# Patient Record
Sex: Female | Born: 1965
Health system: Southern US, Community
[De-identification: ages and names within clinical notes are randomized; demographics above are authoritative.]

---

## 1995-03-26 HISTORY — PX: ELBOW SURGERY: SHX618

## 2004-02-22 ENCOUNTER — Ambulatory Visit: Payer: Self-pay | Admitting: Internal Medicine

## 2004-08-24 ENCOUNTER — Emergency Department (HOSPITAL_COMMUNITY): Admission: EM | Admit: 2004-08-24 | Discharge: 2004-08-24 | Payer: Self-pay | Admitting: Emergency Medicine

## 2007-06-23 ENCOUNTER — Other Ambulatory Visit: Admission: RE | Admit: 2007-06-23 | Discharge: 2007-06-23 | Payer: Self-pay | Admitting: Obstetrics and Gynecology

## 2007-07-14 ENCOUNTER — Encounter: Admission: RE | Admit: 2007-07-14 | Discharge: 2007-07-14 | Payer: Self-pay | Admitting: Obstetrics and Gynecology

## 2010-07-06 ENCOUNTER — Other Ambulatory Visit (HOSPITAL_COMMUNITY)
Admission: RE | Admit: 2010-07-06 | Discharge: 2010-07-06 | Disposition: A | Discharge status: Home / Self Care | Payer: Private Health Insurance - Indemnity | Source: Ambulatory Visit | Attending: Obstetrics and Gynecology | Admitting: Obstetrics and Gynecology

## 2010-07-06 ENCOUNTER — Other Ambulatory Visit: Payer: Self-pay | Admitting: Obstetrics and Gynecology

## 2010-07-06 DIAGNOSIS — Z1231 Encounter for screening mammogram for malignant neoplasm of breast: Secondary | ICD-10-CM

## 2010-07-06 DIAGNOSIS — Z01419 Encounter for gynecological examination (general) (routine) without abnormal findings: Secondary | ICD-10-CM | POA: Insufficient documentation

## 2010-07-06 DIAGNOSIS — Z113 Encounter for screening for infections with a predominantly sexual mode of transmission: Secondary | ICD-10-CM | POA: Insufficient documentation

## 2010-07-10 ENCOUNTER — Ambulatory Visit
Admission: RE | Admit: 2010-07-10 | Discharge: 2010-07-10 | Disposition: A | Discharge status: Home / Self Care | Payer: Private Health Insurance - Indemnity | Source: Ambulatory Visit | Attending: Obstetrics and Gynecology | Admitting: Obstetrics and Gynecology

## 2010-07-10 DIAGNOSIS — Z1231 Encounter for screening mammogram for malignant neoplasm of breast: Secondary | ICD-10-CM

## 2011-06-03 ENCOUNTER — Other Ambulatory Visit: Payer: Self-pay | Admitting: Family Medicine

## 2011-06-03 ENCOUNTER — Other Ambulatory Visit: Payer: Self-pay | Admitting: Family

## 2011-06-03 DIAGNOSIS — Z1231 Encounter for screening mammogram for malignant neoplasm of breast: Secondary | ICD-10-CM

## 2011-07-12 ENCOUNTER — Ambulatory Visit
Admission: RE | Admit: 2011-07-12 | Discharge: 2011-07-12 | Disposition: A | Discharge status: Home / Self Care | Payer: Private Health Insurance - Indemnity | Source: Ambulatory Visit | Attending: Family Medicine | Admitting: Family Medicine

## 2011-07-12 DIAGNOSIS — Z1231 Encounter for screening mammogram for malignant neoplasm of breast: Secondary | ICD-10-CM

## 2012-01-01 ENCOUNTER — Other Ambulatory Visit: Payer: Self-pay | Admitting: Dermatology

## 2012-06-16 ENCOUNTER — Other Ambulatory Visit: Payer: Self-pay

## 2012-06-16 DIAGNOSIS — Z1231 Encounter for screening mammogram for malignant neoplasm of breast: Secondary | ICD-10-CM

## 2012-07-24 ENCOUNTER — Ambulatory Visit
Admission: RE | Admit: 2012-07-24 | Discharge: 2012-07-24 | Disposition: A | Discharge status: Home / Self Care | Payer: Private Health Insurance - Indemnity | Source: Ambulatory Visit

## 2012-07-24 DIAGNOSIS — Z1231 Encounter for screening mammogram for malignant neoplasm of breast: Secondary | ICD-10-CM

## 2013-03-25 DIAGNOSIS — G4700 Insomnia, unspecified: Secondary | ICD-10-CM

## 2013-03-25 HISTORY — DX: Insomnia, unspecified: G47.00

## 2013-07-16 ENCOUNTER — Other Ambulatory Visit: Payer: Self-pay

## 2013-07-16 DIAGNOSIS — Z1231 Encounter for screening mammogram for malignant neoplasm of breast: Secondary | ICD-10-CM

## 2013-07-28 ENCOUNTER — Ambulatory Visit
Admission: RE | Admit: 2013-07-28 | Discharge: 2013-07-28 | Disposition: A | Discharge status: Home / Self Care | Payer: Private Health Insurance - Indemnity | Source: Ambulatory Visit

## 2013-07-28 ENCOUNTER — Encounter (INDEPENDENT_AMBULATORY_CARE_PROVIDER_SITE_OTHER): Payer: Self-pay

## 2013-07-28 DIAGNOSIS — Z1231 Encounter for screening mammogram for malignant neoplasm of breast: Secondary | ICD-10-CM

## 2014-06-28 ENCOUNTER — Other Ambulatory Visit: Payer: Self-pay

## 2014-06-28 DIAGNOSIS — Z1231 Encounter for screening mammogram for malignant neoplasm of breast: Secondary | ICD-10-CM

## 2014-08-01 ENCOUNTER — Ambulatory Visit
Admission: RE | Admit: 2014-08-01 | Discharge: 2014-08-01 | Disposition: A | Discharge status: Home / Self Care | Payer: Managed Care, Other (non HMO) | Source: Ambulatory Visit

## 2014-08-01 DIAGNOSIS — Z1231 Encounter for screening mammogram for malignant neoplasm of breast: Secondary | ICD-10-CM

## 2018-07-06 ENCOUNTER — Other Ambulatory Visit: Payer: Self-pay | Admitting: Nurse Practitioner

## 2018-07-06 DIAGNOSIS — L309 Dermatitis, unspecified: Secondary | ICD-10-CM | POA: Diagnosis not present

## 2018-07-21 DIAGNOSIS — L300 Nummular dermatitis: Secondary | ICD-10-CM | POA: Diagnosis not present

## 2018-07-21 DIAGNOSIS — G4700 Insomnia, unspecified: Secondary | ICD-10-CM | POA: Diagnosis not present

## 2018-07-21 DIAGNOSIS — Z1239 Encounter for other screening for malignant neoplasm of breast: Secondary | ICD-10-CM | POA: Diagnosis not present

## 2018-07-21 DIAGNOSIS — L299 Pruritus, unspecified: Secondary | ICD-10-CM | POA: Diagnosis not present

## 2018-07-22 ENCOUNTER — Other Ambulatory Visit: Payer: Self-pay | Admitting: Physician Assistant

## 2018-07-22 DIAGNOSIS — Z1231 Encounter for screening mammogram for malignant neoplasm of breast: Secondary | ICD-10-CM

## 2018-10-20 ENCOUNTER — Other Ambulatory Visit: Payer: Self-pay

## 2018-10-20 ENCOUNTER — Ambulatory Visit
Admission: RE | Admit: 2018-10-20 | Discharge: 2018-10-20 | Disposition: A | Discharge status: Home / Self Care | Payer: No Typology Code available for payment source | Source: Ambulatory Visit | Attending: Physician Assistant | Admitting: Physician Assistant

## 2018-10-20 DIAGNOSIS — Z1231 Encounter for screening mammogram for malignant neoplasm of breast: Secondary | ICD-10-CM

## 2018-12-07 MED FILL — TERBINAFINE HCL 250 MG TAB: 250 | 90 days supply | Qty: 90 | Fill #0

## 2018-12-16 ENCOUNTER — Telehealth: Payer: Self-pay | Admitting: Allergy & Immunology

## 2018-12-16 DIAGNOSIS — Z20822 Contact with and (suspected) exposure to covid-19: Secondary | ICD-10-CM

## 2018-12-16 NOTE — Telephone Encounter (Signed)
Added COVID antibody testing to labs.   Javanni Maring, MD [facility]   

## 2018-12-16 NOTE — Addendum Note (Signed)
Addended by: Valentina Shaggy on: 12/16/2018 02:02 PM   Modules accepted: Orders

## 2018-12-17 LAB — SARS-COV-2 ANTIBODIES: SARS-CoV-2 Antibodies: NEGATIVE

## 2019-08-24 ENCOUNTER — Other Ambulatory Visit: Payer: Self-pay | Admitting: Nurse Practitioner

## 2019-11-11 MED FILL — GAVILYTE-G SOLUTION: 236 | 1 days supply | Qty: 4000 | Fill #0

## 2019-12-17 ENCOUNTER — Ambulatory Visit (INDEPENDENT_AMBULATORY_CARE_PROVIDER_SITE_OTHER): Payer: No Typology Code available for payment source | Admitting: *Deleted

## 2019-12-17 DIAGNOSIS — Z23 Encounter for immunization: Secondary | ICD-10-CM | POA: Diagnosis not present

## 2019-12-17 NOTE — Progress Notes (Signed)
   Covid-19 Vaccination Clinic  Name:  Patricia Goodman    MRN: [id number] DOB: 02-20-1966  12/17/2019  Patricia Goodman was observed post Covid-19 immunization for 30 minutes based on pre-vaccination screening without incident. She was provided with Vaccine Information Sheet and instruction to access the V-Safe system.   Patricia Goodman was instructed to call 911 with any severe reactions post vaccine: Marland Kitchen Difficulty breathing  . Swelling of face and throat  . A fast heartbeat  . A bad rash all over body  . Dizziness and weakness

## 2020-03-06 ENCOUNTER — Other Ambulatory Visit: Payer: Self-pay | Admitting: Nurse Practitioner

## 2020-03-06 DIAGNOSIS — Z1231 Encounter for screening mammogram for malignant neoplasm of breast: Secondary | ICD-10-CM

## 2020-04-14 ENCOUNTER — Ambulatory Visit: Payer: No Typology Code available for payment source

## 2020-04-25 ENCOUNTER — Other Ambulatory Visit (HOSPITAL_BASED_OUTPATIENT_CLINIC_OR_DEPARTMENT_OTHER): Payer: Self-pay | Admitting: Nurse Practitioner

## 2020-04-25 MED FILL — valACYclovir HCL 1 GM TABS: 1 | 7 days supply | Qty: 21 | Fill #0

## 2020-06-06 ENCOUNTER — Inpatient Hospital Stay: Admission: RE | Admit: 2020-06-06 | Payer: No Typology Code available for payment source | Source: Ambulatory Visit

## 2020-07-21 ENCOUNTER — Other Ambulatory Visit: Payer: Self-pay

## 2020-07-21 ENCOUNTER — Ambulatory Visit: Payer: No Typology Code available for payment source

## 2020-08-01 ENCOUNTER — Other Ambulatory Visit (HOSPITAL_BASED_OUTPATIENT_CLINIC_OR_DEPARTMENT_OTHER): Payer: Self-pay

## 2020-08-01 MED ORDER — ZOLPIDEM TARTRATE 10 MG PO TABS
ORAL_TABLET | ORAL | 1 refills | Status: AC
  Filled 2020-08-01: qty 30, 30d supply, fill #0

## 2020-08-15 ENCOUNTER — Other Ambulatory Visit: Payer: Self-pay | Admitting: Nurse Practitioner

## 2020-08-15 ENCOUNTER — Other Ambulatory Visit: Payer: Self-pay

## 2020-08-15 ENCOUNTER — Ambulatory Visit
Admission: RE | Admit: 2020-08-15 | Discharge: 2020-08-15 | Disposition: A | Discharge status: Home / Self Care | Payer: No Typology Code available for payment source | Source: Ambulatory Visit | Attending: Nurse Practitioner | Admitting: Nurse Practitioner

## 2020-08-15 DIAGNOSIS — Z1231 Encounter for screening mammogram for malignant neoplasm of breast: Secondary | ICD-10-CM

## 2020-08-15 DIAGNOSIS — N6459 Other signs and symptoms in breast: Secondary | ICD-10-CM

## 2020-09-22 ENCOUNTER — Ambulatory Visit
Admission: RE | Admit: 2020-09-22 | Discharge: 2020-09-22 | Disposition: A | Discharge status: Home / Self Care | Payer: No Typology Code available for payment source | Source: Ambulatory Visit | Attending: Nurse Practitioner | Admitting: Nurse Practitioner

## 2020-09-22 ENCOUNTER — Other Ambulatory Visit: Payer: Self-pay

## 2020-09-22 DIAGNOSIS — N6459 Other signs and symptoms in breast: Secondary | ICD-10-CM

## 2020-10-04 ENCOUNTER — Ambulatory Visit (INDEPENDENT_AMBULATORY_CARE_PROVIDER_SITE_OTHER): Payer: No Typology Code available for payment source

## 2020-10-04 ENCOUNTER — Other Ambulatory Visit: Payer: Self-pay

## 2020-10-04 DIAGNOSIS — Z23 Encounter for immunization: Secondary | ICD-10-CM | POA: Diagnosis not present

## 2020-10-04 NOTE — Progress Notes (Signed)
   Covid-19 Vaccination Clinic  Name:  Patricia Goodman    MRN: [id number] DOB: 1965-05-08  10/04/2020  [name] was observed post Covid-19 immunization for 15 minutes without incident. She was provided with Vaccine Information Sheet and instruction to access the V-Safe system.   [name] was instructed to call 911 with any severe reactions post vaccine: Difficulty breathing  Swelling of face and throat  A fast heartbeat  A bad rash all over body  Dizziness and weakness   Immunizations Administered     Name Date Dose VIS Date Route   PFIZER Comrnaty(Gray TOP) Covid-19 Vaccine 10/04/2020  1:39 PM 0.3 mL 03/02/2020 Intramuscular   Manufacturer: [employer], [employer]   Lot: [id number]   NDC: [phone]

## 2021-03-05 ENCOUNTER — Other Ambulatory Visit: Payer: Self-pay

## 2021-03-05 ENCOUNTER — Ambulatory Visit (INDEPENDENT_AMBULATORY_CARE_PROVIDER_SITE_OTHER): Payer: No Typology Code available for payment source

## 2021-03-05 DIAGNOSIS — Z23 Encounter for immunization: Secondary | ICD-10-CM | POA: Diagnosis not present

## 2021-03-05 NOTE — Progress Notes (Signed)
   Covid-19 Vaccination Clinic  Name:  ZYANNA LEISINGER    MRN: [id number] DOB: 12-15-1965  03/05/2021  Ms. Lyttle was observed post Covid-19 immunization for 15 minutes without incident. She was provided with Vaccine Information Sheet and instruction to access the V-Safe system.   Ms. Wurtz was instructed to call 911 with any severe reactions post vaccine: Difficulty breathing  Swelling of face and throat  A fast heartbeat  A bad rash all over body  Dizziness and weakness   Immunizations Administered     Name Date Dose VIS Date Route   Pfizer Covid-19 Vaccine Bivalent Booster 03/05/2021  9:55 AM 0.3 mL 11/22/2020 Intramuscular   Manufacturer: [employer], [employer]   Lot: [id number]   NDC: [phone]

## 2021-10-02 ENCOUNTER — Telehealth: Payer: Self-pay | Admitting: Allergy & Immunology

## 2021-10-02 NOTE — Telephone Encounter (Signed)
I contacted Patricia Goodman or the patient's caregiver to inform them that she received an expired dose of Pfizer COVID-19 vaccine from our office, during their visit(s), on July 2022 and December 2022. The dose was expired by 125 and 83 days, respectively. This was the patient's booster doses. I shared the following information with the patient or caregiver: vaccines given after they have expired may be less effective but we are not aware of any other adverse effects. The patient can be re-vaccinated at no cost if the patient decides to do so. Answered patient questions/concerns. Encouraged patient to reach out if they have any additional questions or concerns.  The patient decided to be re-vaccinated with the reformulated vaccine in the fall.  Patricia Bonds, MD [facility]

## 2022-05-16 ENCOUNTER — Other Ambulatory Visit (HOSPITAL_COMMUNITY): Payer: Self-pay

## 2022-05-16 ENCOUNTER — Other Ambulatory Visit: Payer: Self-pay

## 2022-05-16 MED ORDER — ESTRADIOL 0.1 MG/GM VA CREA
TOPICAL_CREAM | VAGINAL | 11 refills | Status: DC
  Filled 2022-05-16: qty 42.5, 90d supply, fill #0
  Filled 2022-08-16: qty 42.5, 90d supply, fill #1

## 2022-05-29 DIAGNOSIS — Z3202 Encounter for pregnancy test, result negative: Secondary | ICD-10-CM | POA: Diagnosis not present

## 2022-05-29 DIAGNOSIS — R87810 Cervical high risk human papillomavirus (HPV) DNA test positive: Secondary | ICD-10-CM | POA: Diagnosis not present

## 2022-05-29 DIAGNOSIS — N870 Mild cervical dysplasia: Secondary | ICD-10-CM | POA: Diagnosis not present

## 2022-08-09 DIAGNOSIS — F5104 Psychophysiologic insomnia: Secondary | ICD-10-CM | POA: Diagnosis not present

## 2022-08-16 ENCOUNTER — Other Ambulatory Visit (HOSPITAL_COMMUNITY): Payer: Self-pay

## 2022-09-20 IMAGING — MG DIGITAL DIAGNOSTIC BILAT W/ TOMO W/ CAD
6 of 10 series · 6 of 30 positions shown · non-contrast
Comparison: Previous exam(s).

CLINICAL DATA: Patient complains of left nipple inversion.

EXAM:
DIGITAL DIAGNOSTIC BILATERAL MAMMOGRAM WITH TOMOSYNTHESIS AND CAD;
ULTRASOUND LEFT BREAST LIMITED
TECHNIQUE: Bilateral digital diagnostic mammography and breast tomosynthesis
was performed. The images were evaluated with computer-aided
detection.; Targeted ultrasound examination of the left breast was
performed

[L CC synth-2D (1 of 2)]
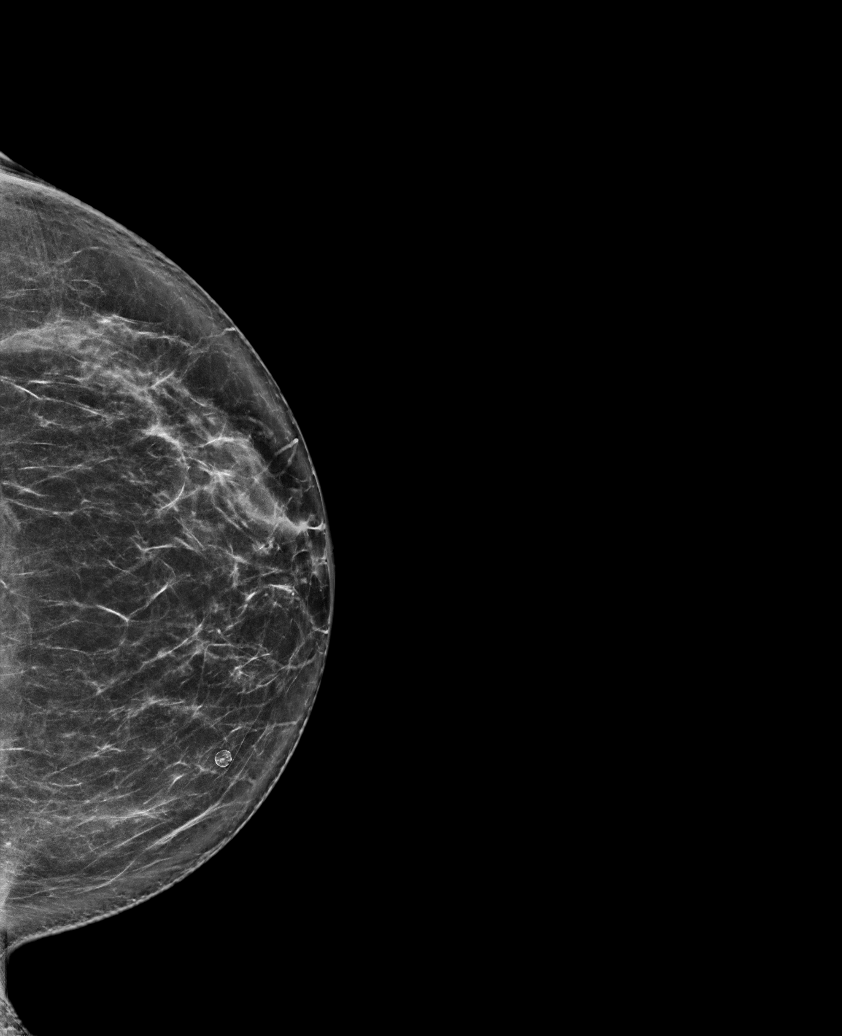

[R MLO synth-2D]
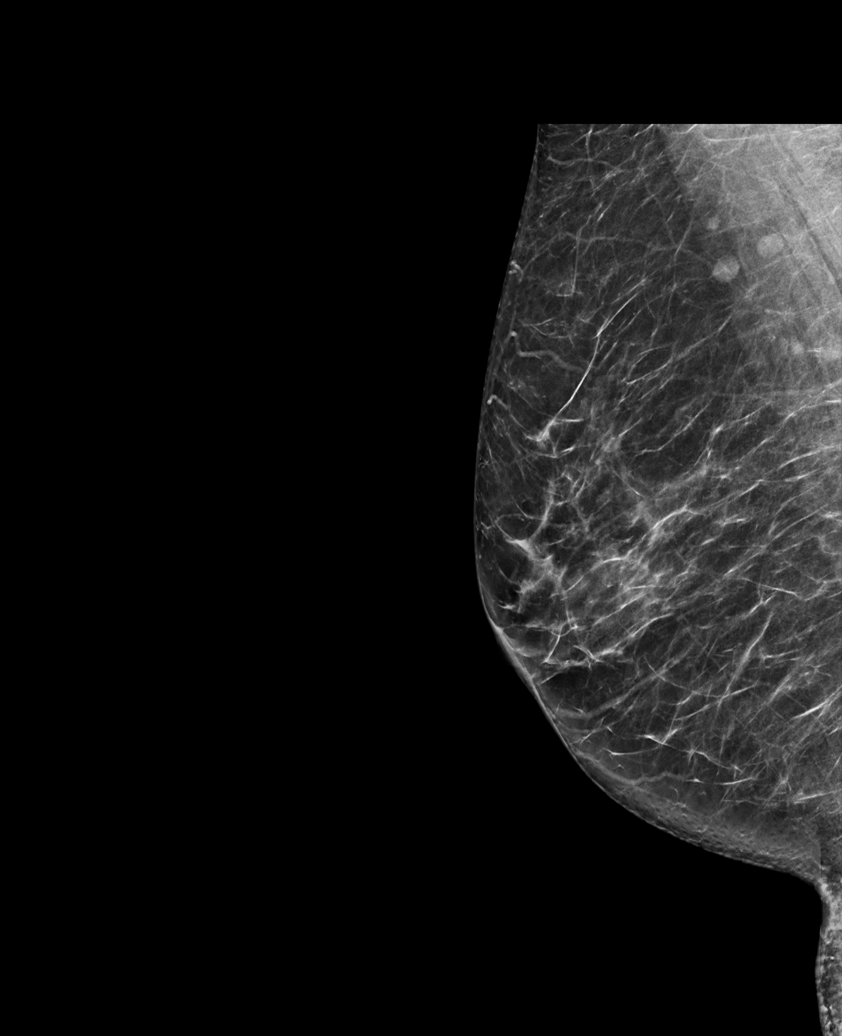

[R CC synth-2D]
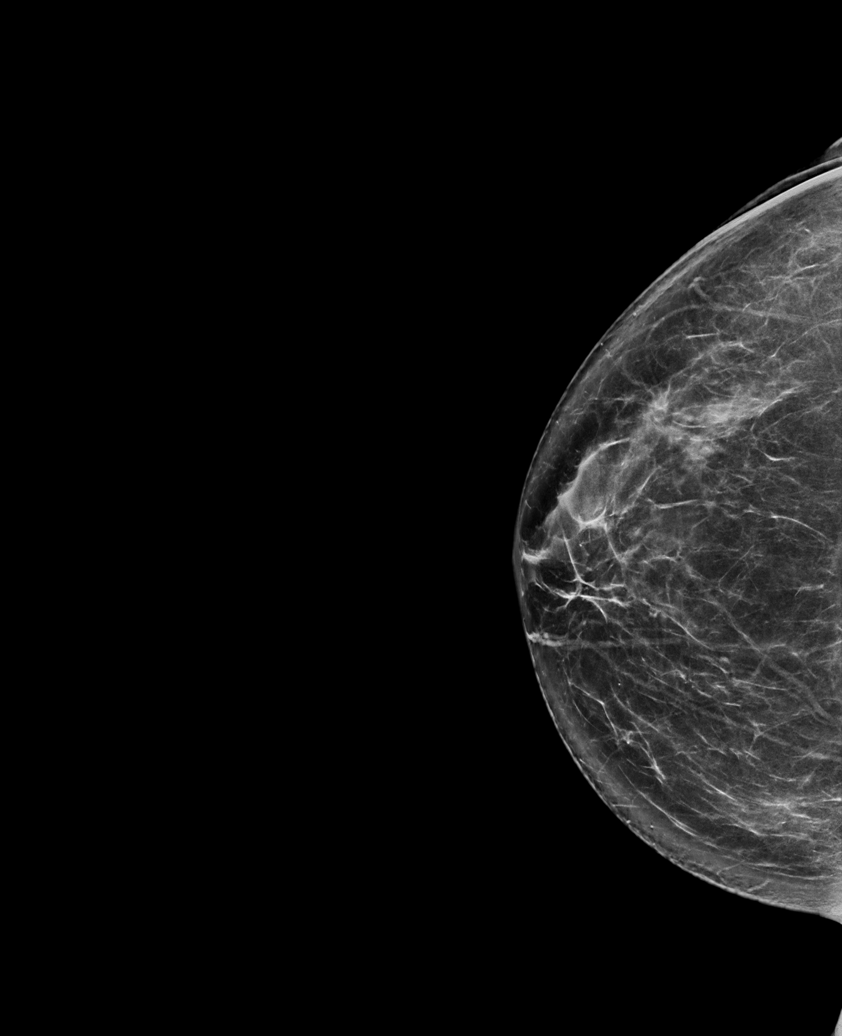

[L CC synth-2D (2 of 2)]
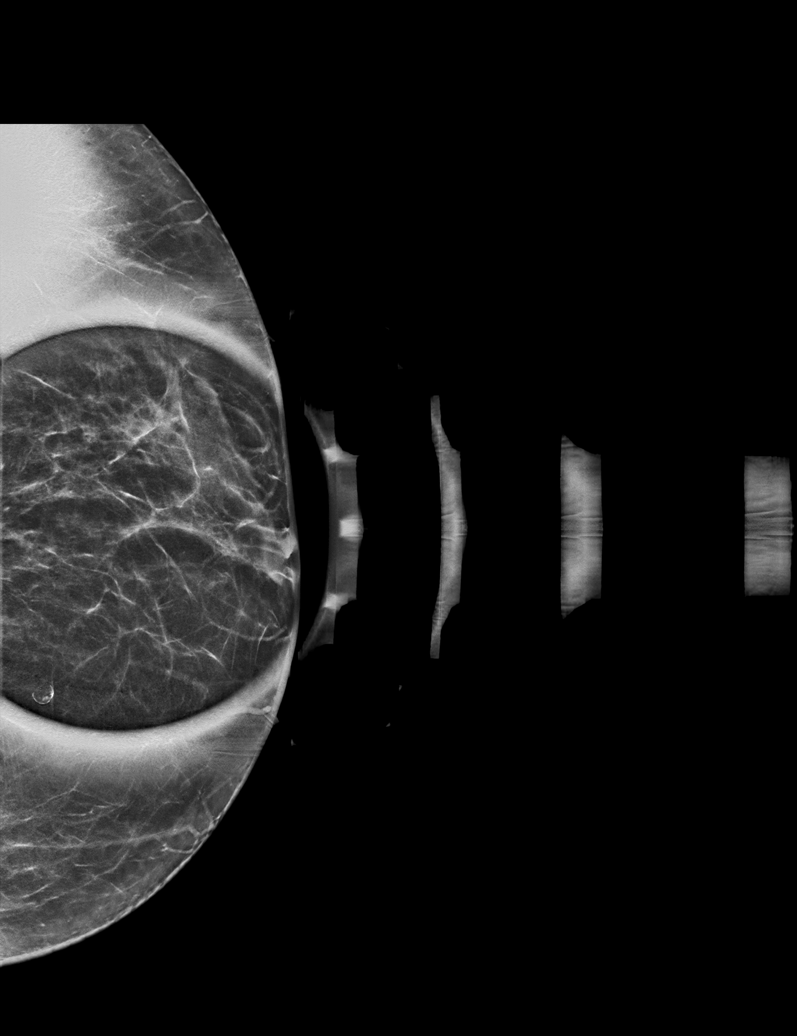

[L MLO synth-2D]
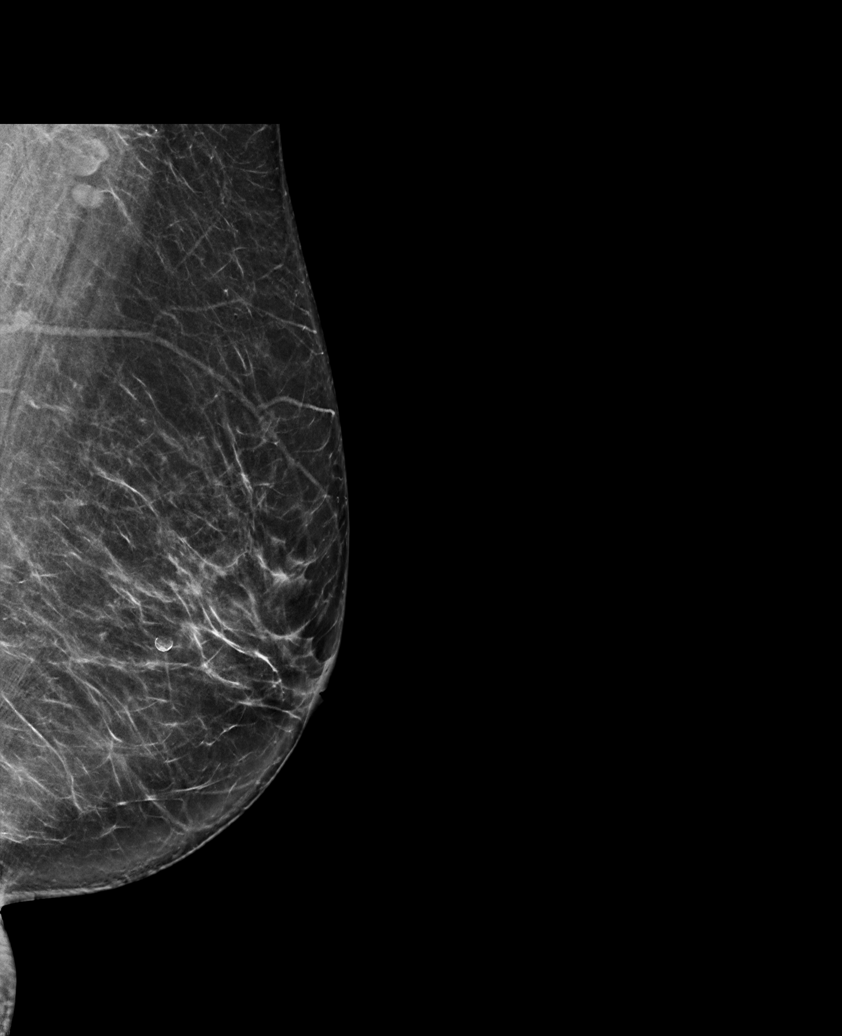

[R CC tomo · tomo slice 41/81.0]
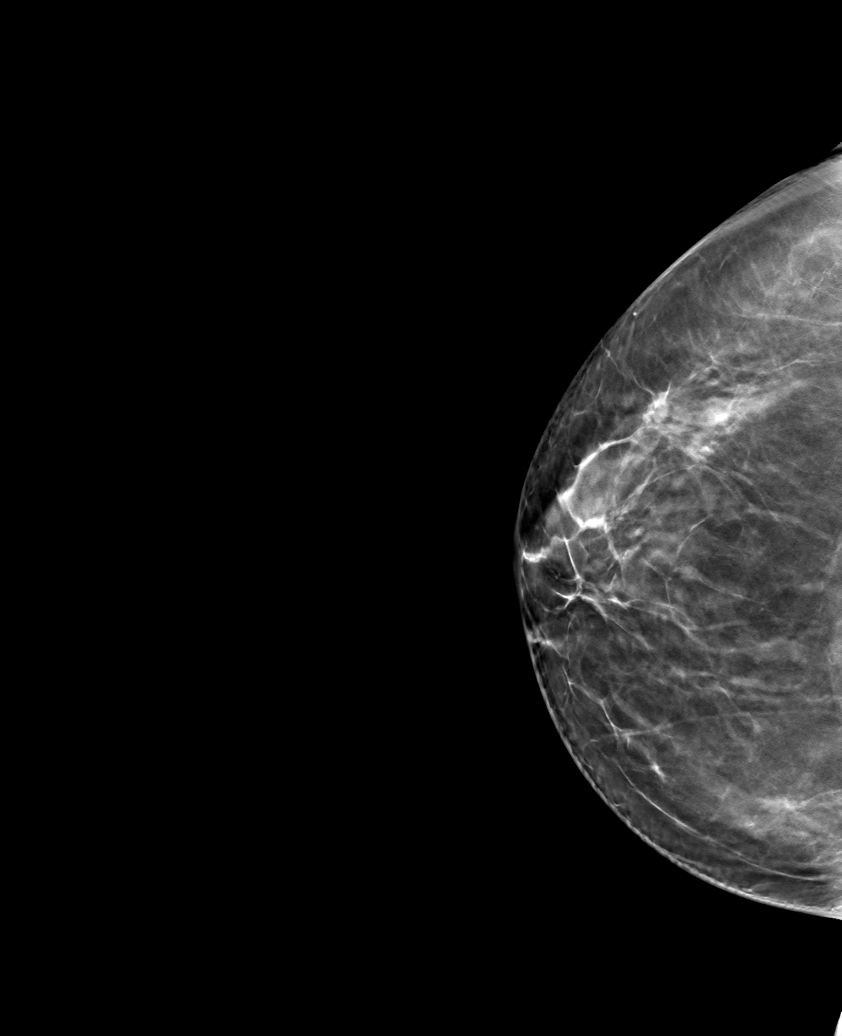

[6 of 30 positions shown; findings below may reference images not displayed]

ACR Breast Density Category b: There are scattered areas of
fibroglandular density.
FINDINGS: No suspicious mass, malignant type microcalcifications or distortion
detected in either breast. Spot tangential view of the subareolar
region of the left breast shows normal fibroglandular tissue.

On physical exam, there is a minor indentation of the left nipple.
It appears symmetric to the right nipple. I do not palpate a
subareolar mass.

Targeted ultrasound is performed, showing normal tissue in the
subareolar region of the left breast. No intraductal mass,
suspicious mass, distortion or abnormal shadowing detected.
IMPRESSION: No evidence of malignancy in either breast.

RECOMMENDATION:
Bilateral screening mammogram in 1 year is recommended.

The importance of self-breast examination was discussed with the
patient. If the patient notices any suspicious findings additional
imaging evaluation would be recommended.

I have discussed the findings and recommendations with the patient.
If applicable, a reminder letter will be sent to the patient
regarding the next appointment.

BI-RADS CATEGORY  1: Negative.

## 2022-10-24 DIAGNOSIS — M25552 Pain in left hip: Secondary | ICD-10-CM | POA: Diagnosis not present

## 2022-10-24 DIAGNOSIS — M25551 Pain in right hip: Secondary | ICD-10-CM | POA: Diagnosis not present

## 2022-11-13 ENCOUNTER — Ambulatory Visit
Admission: RE | Admit: 2022-11-13 | Discharge: 2022-11-13 | Disposition: A | Discharge status: Home / Self Care | Payer: 59 | Source: Ambulatory Visit | Attending: Family Medicine | Admitting: Family Medicine

## 2022-11-13 ENCOUNTER — Other Ambulatory Visit: Payer: Self-pay | Admitting: Family Medicine

## 2022-11-13 DIAGNOSIS — M25551 Pain in right hip: Secondary | ICD-10-CM

## 2022-11-13 DIAGNOSIS — M160 Bilateral primary osteoarthritis of hip: Secondary | ICD-10-CM | POA: Diagnosis not present

## 2022-11-13 DIAGNOSIS — M25552 Pain in left hip: Secondary | ICD-10-CM | POA: Diagnosis not present

## 2022-12-11 DIAGNOSIS — M25552 Pain in left hip: Secondary | ICD-10-CM | POA: Diagnosis not present

## 2022-12-11 DIAGNOSIS — Z1231 Encounter for screening mammogram for malignant neoplasm of breast: Secondary | ICD-10-CM | POA: Diagnosis not present

## 2022-12-20 ENCOUNTER — Other Ambulatory Visit (HOSPITAL_COMMUNITY): Payer: Self-pay

## 2022-12-20 MED ORDER — MELOXICAM 15 MG PO TABS
15.0000 mg | ORAL_TABLET | Freq: Every day | ORAL | 3 refills | Status: DC | PRN
  Filled 2022-12-20 – 2022-12-23 (×2): qty 30, 30d supply, fill #0
  Filled 2023-05-03: qty 30, 30d supply, fill #1

## 2022-12-21 ENCOUNTER — Other Ambulatory Visit (HOSPITAL_COMMUNITY): Payer: Self-pay

## 2022-12-23 ENCOUNTER — Other Ambulatory Visit (HOSPITAL_COMMUNITY): Payer: Self-pay

## 2022-12-23 ENCOUNTER — Other Ambulatory Visit: Payer: Self-pay

## 2022-12-29 DIAGNOSIS — M545 Low back pain, unspecified: Secondary | ICD-10-CM | POA: Diagnosis not present

## 2023-03-26 DIAGNOSIS — M1612 Unilateral primary osteoarthritis, left hip: Secondary | ICD-10-CM

## 2023-03-26 HISTORY — DX: Unilateral primary osteoarthritis, left hip: M16.12

## 2023-04-02 DIAGNOSIS — M25552 Pain in left hip: Secondary | ICD-10-CM | POA: Diagnosis not present

## 2023-04-02 DIAGNOSIS — F5104 Psychophysiologic insomnia: Secondary | ICD-10-CM | POA: Diagnosis not present

## 2023-04-17 ENCOUNTER — Other Ambulatory Visit: Payer: Self-pay

## 2023-04-17 ENCOUNTER — Ambulatory Visit: Payer: Commercial Managed Care - PPO | Admitting: Sports Medicine

## 2023-04-17 ENCOUNTER — Ambulatory Visit: Payer: Commercial Managed Care - PPO | Admitting: Orthopaedic Surgery

## 2023-04-17 DIAGNOSIS — M1612 Unilateral primary osteoarthritis, left hip: Secondary | ICD-10-CM

## 2023-04-17 MED ORDER — LIDOCAINE HCL 1 % IJ SOLN
4.0000 mL | INTRAMUSCULAR | Status: AC | PRN
  Administered 2023-04-17: 4 mL

## 2023-04-17 MED ORDER — METHYLPREDNISOLONE ACETATE 40 MG/ML IJ SUSP
80.0000 mg | INTRAMUSCULAR | Status: AC | PRN
  Administered 2023-04-17: 80 mg via INTRA_ARTICULAR

## 2023-04-17 NOTE — Progress Notes (Signed)
   Procedure Note  Patient: Patricia Goodman             Date of Birth: 12/07/1965           MRN: [id number]             Visit Date: 04/17/2023  Procedures: Visit Diagnoses:  1. Primary osteoarthritis of left hip    Large Joint Inj: L hip joint on 04/17/2023 11:32 AM Indications: pain Details: 22 G 3.5 in needle, ultrasound-guided anterior approach Medications: 4 mL lidocaine 1 %; 80 mg methylPREDNISolone acetate 40 MG/ML Outcome: tolerated well, no immediate complications  Procedure: US-guided intra-articular hip injection, Left  After discussion on risks/benefits/indications and informed verbal consent was obtained, a timeout was performed. Patient was lying supine on exam table. The hip was cleaned with betadine and alcohol swabs. Then utilizing ultrasound guidance, the patient's femoral head and neck junction was identified and subsequently injected with 4:2 lidocaine:depomedrol via an in-plane approach with ultrasound visualization of the injectate administered into the hip joint. Patient tolerated procedure well without immediate complications.  Procedure, treatment alternatives, risks and benefits explained, specific risks discussed. Consent was given by the patient. Immediately prior to procedure a time out was called to verify the correct patient, procedure, equipment, support staff and site/side marked as required. Patient was prepped and draped in the usual sterile fashion.     - follow-up with Dr. Roda Shutters as indicated; I am happy to see them as needed  Madelyn Brunner, DO Primary Care Sports Medicine Physician  [facility]  This note was dictated using Dragon naturally speaking software and may contain errors in syntax, spelling, or content which have not been identified prior to signing this note.

## 2023-04-17 NOTE — Progress Notes (Signed)
Office Visit Note   Patient: Patricia Goodman           Date of Birth: [birth year]           MRN: [id number] Visit Date: 04/17/2023              Requested by: Joycelyn Rua, MD [address] [place],  [place] [address] PCP: Joycelyn Rua, MD   Assessment & Plan: Visit Diagnoses:  1. Primary osteoarthritis of left hip     Plan: Impression is 58 year old female with symptomatic left hip osteoarthritis.  There are degenerative changes on x-rays from August.  Treatment options were given and she would like to do outpatient physical therapy and try cortisone injection.  She currently takes ibuprofen as needed.  Follow-up as needed.  Follow-Up Instructions: No follow-ups on file.   Orders:  Orders Placed This Encounter  Procedures   Ambulatory referral to Physical Therapy   No orders of the defined types were placed in this encounter.     Procedures: No procedures performed   Clinical Data: No additional findings.   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  She comes in for worsening left hip pain for the last year.  She has had chronic pain since 2008 but has been manageable until the last year.  Denies any changes in activity or weight.  Feels a dull aching pain deep inside the hip and sometimes into the groin. Review of Systems  Constitutional: Negative.   HENT: Negative.    Eyes: Negative.   Respiratory: Negative.    Cardiovascular: Negative.   Endocrine: Negative.   Musculoskeletal: Negative.   Neurological: Negative.   Hematological: Negative.   Psychiatric/Behavioral: Negative.    All other systems reviewed and are negative.    Objective: Vital Signs: There were no vitals taken for this visit.  Physical Exam Vitals and nursing note reviewed.  Constitutional:      Appearance: She is well-developed.  HENT:     Head:  Atraumatic.     Nose: Nose normal.  Eyes:     Extraocular Movements: Extraocular movements intact.  Cardiovascular:     Pulses: Normal pulses.  Pulmonary:     Effort: Pulmonary effort is normal.  Abdominal:     Palpations: Abdomen is soft.  Musculoskeletal:     Cervical back: Neck supple.  Skin:    General: Skin is warm.     Capillary Refill: Capillary refill takes less than 2 seconds.  Neurological:     Mental Status: She is alert. Mental status is at baseline.  Psychiatric:        Behavior: Behavior normal.        Thought Content: Thought content normal.        Judgment: Judgment normal.     Ortho Exam Examination of the left hip shows no trochanteric tenderness or sciatic tension signs.  She has pain with logroll internally and with FABER and FADIR.  Negative [place]field. Specialty Comments:  No specialty comments available.  Imaging: No results found.   PMFS History: There are no active problems to display for this patient.  No past medical history on file.  Family History  Problem Relation Age of Onset   Breast cancer Maternal Grandmother     No past surgical history on file. Social History   Occupational History  Not on file  Tobacco Use   Smoking status: Not on file   Smokeless tobacco: Not on file  Substance and Sexual Activity   Alcohol use: Not on file   Drug use: Not on file   Sexual activity: Not on file

## 2023-04-22 ENCOUNTER — Ambulatory Visit (INDEPENDENT_AMBULATORY_CARE_PROVIDER_SITE_OTHER): Payer: Commercial Managed Care - PPO | Admitting: Orthopaedic Surgery

## 2023-04-22 DIAGNOSIS — M1612 Unilateral primary osteoarthritis, left hip: Secondary | ICD-10-CM

## 2023-04-22 MED ORDER — HYDROCODONE-ACETAMINOPHEN 5-325 MG PO TABS: 1.0000 | ORAL_TABLET | Freq: Four times a day (QID) | ORAL | 0 refills | Status: DC | PRN

## 2023-04-22 NOTE — Progress Notes (Signed)
   Office Visit Note   Patient: Patricia Goodman           Date of Birth: Jun 07, 1965           MRN: [id number] Visit Date: 04/22/2023              Requested by: Joycelyn Rua, MD [address] [place],  [place] [address] PCP: Joycelyn Rua, MD   Assessment & Plan: Visit Diagnoses:  1. Primary osteoarthritis of left hip     Plan: And is a 58 year old female with left hip pain from postinjection flareup.  I will send in Norco for breakthrough pain.  Prescription for cane if needed.  Out of work note for a week for now but I am happy to extend it as needed.  She will continue to take ibuprofen.  Follow-Up Instructions: No follow-ups on file.   Orders:  No orders of the defined types were placed in this encounter.  Meds ordered this encounter  Medications   HYDROcodone-acetaminophen (NORCO) 5-325 MG tablet    Sig: Take 1 tablet by mouth every 6 (six) hours as needed.    Dispense:  20 tablet    Refill:  0      Procedures: No procedures performed   Clinical Data: No additional findings.   Subjective: Chief Complaint  Patient presents with   Left Hip - Pain    HPI Patient is a very pleasant 58 year old female comes in for increasing left hip pain 1 week status post left hip injection for osteoarthritis.  Pain gradually got worse and she was unable to work or walk due to it.  Ibuprofen helps temporarily. Review of Systems   Objective: Vital Signs: There were no vitals taken for this visit.  Physical Exam  Ortho Exam Examination of the left hip shows pain with weightbearing and antalgic gait.  Pain with passive range of motion. Specialty Comments:  No specialty comments available.  Imaging: No results found.   PMFS History: There are no active problems to display for this patient.  No past medical history on file.  Family History  Problem Relation Age of Onset   Breast cancer Maternal Grandmother     No past surgical history on file. Social History    Occupational History   Not on file  Tobacco Use   Smoking status: Not on file   Smokeless tobacco: Not on file  Substance and Sexual Activity   Alcohol use: Not on file   Drug use: Not on file   Sexual activity: Not on file

## 2023-04-24 ENCOUNTER — Encounter: Payer: Self-pay | Admitting: Orthopaedic Surgery

## 2023-04-24 NOTE — Telephone Encounter (Signed)
Please order MRI of the left hip to rule out fracture.  Thanks.

## 2023-04-25 ENCOUNTER — Other Ambulatory Visit: Payer: Self-pay

## 2023-04-25 ENCOUNTER — Other Ambulatory Visit: Payer: Self-pay | Admitting: Orthopaedic Surgery

## 2023-04-25 DIAGNOSIS — M25552 Pain in left hip: Secondary | ICD-10-CM

## 2023-04-25 MED ORDER — OXYCODONE-ACETAMINOPHEN 5-325 MG PO TABS: 1.0000 | ORAL_TABLET | Freq: Two times a day (BID) | ORAL | 0 refills | Status: DC | PRN

## 2023-04-25 NOTE — Telephone Encounter (Signed)
I sent Percocet.

## 2023-04-28 ENCOUNTER — Encounter: Payer: Self-pay | Admitting: Orthopaedic Surgery

## 2023-04-28 ENCOUNTER — Other Ambulatory Visit: Payer: Self-pay | Admitting: Physician Assistant

## 2023-04-28 ENCOUNTER — Ambulatory Visit
Admission: RE | Admit: 2023-04-28 | Discharge: 2023-04-28 | Disposition: A | Discharge status: Home / Self Care | Payer: Commercial Managed Care - PPO | Source: Ambulatory Visit | Attending: Orthopaedic Surgery | Admitting: Orthopaedic Surgery

## 2023-04-28 DIAGNOSIS — M25552 Pain in left hip: Secondary | ICD-10-CM | POA: Diagnosis not present

## 2023-04-28 DIAGNOSIS — M25452 Effusion, left hip: Secondary | ICD-10-CM | POA: Diagnosis not present

## 2023-04-28 DIAGNOSIS — R600 Localized edema: Secondary | ICD-10-CM | POA: Diagnosis not present

## 2023-04-28 NOTE — Telephone Encounter (Signed)
It sounds like it is taking a few weeks for the MRIs to be ready, so I would come back two weeks after the MRI unless we get the report sooner and need to see her back sooner

## 2023-04-28 NOTE — Telephone Encounter (Signed)
Looks like oxy was sent in on 1/31.  Cannot write for anything stronger.  Ok for another week out of work

## 2023-04-29 ENCOUNTER — Other Ambulatory Visit: Payer: Self-pay | Admitting: Orthopaedic Surgery

## 2023-04-29 MED ORDER — PREDNISONE 10 MG (21) PO TBPK: ORAL_TABLET | ORAL | 3 refills | Status: DC

## 2023-04-29 MED ORDER — METHOCARBAMOL 500 MG PO TABS: 500.0000 mg | ORAL_TABLET | Freq: Four times a day (QID) | ORAL | 2 refills | Status: DC | PRN

## 2023-04-30 ENCOUNTER — Other Ambulatory Visit: Payer: Self-pay | Admitting: Orthopaedic Surgery

## 2023-04-30 NOTE — Telephone Encounter (Signed)
 I would hold this until tomorrow when Christiane Cowing and Loris Ros are back because it looks like she is not postop so not sure if they typically would refill this medication

## 2023-05-01 NOTE — Progress Notes (Signed)
 Office Visit Note   Patient: Patricia Goodman           Date of Birth: Jan 19, 1966           MRN: [id number] Visit Date: 05/02/2023              Requested by: Nanci Senior, MD [address] [place],  [place] [address] PCP: Nanci Senior, MD   Assessment & Plan: Visit Diagnoses:  1. Pain in left hip     Plan: MRI of the left hip shows mild degenerative changes within the hip joint and degenerative labral tear.  She does have widespread periarticular soft tissue edema in the surrounding hip musculature.  Based on these findings I have recommended continued rest and activity modification.  Will obtain arthritis panel.  60 mg of IM Toradol  administered.  Out of work note for 2 weeks.  Zanaflex  prescribed.  Follow-Up Instructions: No follow-ups on file.   Orders:  No orders of the defined types were placed in this encounter.  Meds ordered this encounter  Medications   tiZANidine  (ZANAFLEX ) 4 MG tablet    Sig: Take 1 tablet (4 mg total) by mouth every 6 (six) hours as needed for muscle spasms.    Dispense:  30 tablet    Refill:  2      Procedures: No procedures performed   Clinical Data: No additional findings.   Subjective: Chief Complaint  Patient presents with   Other    Review MRI    HPI Patient returns today to discuss MRI scan.  She denies any significant improvement in symptoms. Review of Systems  Constitutional: Negative.   HENT: Negative.    Eyes: Negative.   Respiratory: Negative.    Cardiovascular: Negative.   Endocrine: Negative.   Musculoskeletal: Negative.   Neurological: Negative.   Hematological: Negative.   Psychiatric/Behavioral: Negative.    All other systems reviewed and are negative.    Objective: Vital Signs: There were no vitals taken for this visit.  Physical Exam Vitals and nursing note reviewed.  Constitutional:      Appearance: She is well-developed.  HENT:     Head: Normocephalic and atraumatic.  Pulmonary:      Effort: Pulmonary effort is normal.  Abdominal:     Palpations: Abdomen is soft.  Musculoskeletal:     Cervical back: Neck supple.  Skin:    General: Skin is warm.     Capillary Refill: Capillary refill takes less than 2 seconds.  Neurological:     Mental Status: She is alert and oriented to person, place, and time.  Psychiatric:        Behavior: Behavior normal.        Thought Content: Thought content normal.        Judgment: Judgment normal.    Ortho Exam Examination of the left hip shows pain with all planes of motion.  Antalgic gait. Specialty Comments:  No specialty comments available.  Imaging: No results found.   PMFS History: There are no active problems to display for this patient.  No past medical history on file.  Family History  Problem Relation Age of Onset   Breast cancer Maternal Grandmother     No past surgical history on file. Social History   Occupational History   Not on file  Tobacco Use   Smoking status: Not on file   Smokeless tobacco: Not on file  Substance and Sexual Activity   Alcohol use: Not on file  Drug use: Not on file   Sexual activity: Not on file

## 2023-05-02 ENCOUNTER — Ambulatory Visit (INDEPENDENT_AMBULATORY_CARE_PROVIDER_SITE_OTHER): Payer: Commercial Managed Care - PPO | Admitting: Orthopaedic Surgery

## 2023-05-02 ENCOUNTER — Ambulatory Visit: Payer: Commercial Managed Care - PPO | Admitting: Orthopaedic Surgery

## 2023-05-02 DIAGNOSIS — M25552 Pain in left hip: Secondary | ICD-10-CM

## 2023-05-02 MED ORDER — TIZANIDINE HCL 4 MG PO TABS: 4.0000 mg | ORAL_TABLET | Freq: Four times a day (QID) | ORAL | 2 refills | Status: DC | PRN

## 2023-05-02 NOTE — Addendum Note (Signed)
 Addended by: Darlen Eglin on: 05/02/2023 08:50 AM   Modules accepted: Orders

## 2023-05-03 ENCOUNTER — Ambulatory Visit: Payer: Commercial Managed Care - PPO

## 2023-05-03 ENCOUNTER — Other Ambulatory Visit (HOSPITAL_COMMUNITY): Payer: Self-pay

## 2023-05-04 LAB — CBC WITH DIFFERENTIAL/PLATELET
Absolute Lymphocytes: 1101 {cells}/uL (ref 850–3900)
Absolute Monocytes: 1144 {cells}/uL — ABNORMAL HIGH (ref 200–950)
Basophils Absolute: 29 {cells}/uL (ref 0–200)
Basophils Relative: 0.2 %
Eosinophils Absolute: 14 {cells}/uL — ABNORMAL LOW (ref 15–500)
Eosinophils Relative: 0.1 %
HCT: 44.5 % (ref 35.0–45.0)
Hemoglobin: 14.6 g/dL (ref 11.7–15.5)
MCH: 30.5 pg (ref 27.0–33.0)
MCHC: 32.8 g/dL (ref 32.0–36.0)
MCV: 92.9 fL (ref 80.0–100.0)
MPV: 10.5 fL (ref 7.5–12.5)
Monocytes Relative: 8 %
Neutro Abs: 12012 {cells}/uL — ABNORMAL HIGH (ref 1500–7800)
Neutrophils Relative %: 84 %
Platelets: 467 10*3/uL — ABNORMAL HIGH (ref 140–400)
RBC: 4.79 10*6/uL (ref 3.80–5.10)
RDW: 12 % (ref 11.0–15.0)
Total Lymphocyte: 7.7 %
WBC: 14.3 10*3/uL — ABNORMAL HIGH (ref 3.8–10.8)

## 2023-05-04 LAB — ANA: Anti Nuclear Antibody (ANA): POSITIVE — AB

## 2023-05-04 LAB — C-REACTIVE PROTEIN: CRP: 81.1 mg/L — ABNORMAL HIGH (ref <8.0)

## 2023-05-04 LAB — RHEUMATOID FACTOR: Rheumatoid fact SerPl-aCnc: 13 [IU]/mL (ref <14)

## 2023-05-04 LAB — CYCLIC CITRUL PEPTIDE ANTIBODY, IGG: Cyclic Citrullin Peptide Ab: <16 U

## 2023-05-04 LAB — ANTI-NUCLEAR AB-TITER (ANA TITER): ANA Titer 1: 1:40 {titer} — ABNORMAL HIGH

## 2023-05-04 LAB — SEDIMENTATION RATE: Sed Rate: 89 mm/h — ABNORMAL HIGH (ref 0–30)

## 2023-05-05 ENCOUNTER — Other Ambulatory Visit: Payer: Self-pay

## 2023-05-05 ENCOUNTER — Encounter: Payer: Self-pay | Admitting: Orthopaedic Surgery

## 2023-05-05 DIAGNOSIS — M25552 Pain in left hip: Secondary | ICD-10-CM

## 2023-05-05 NOTE — Progress Notes (Signed)
 Please send referral to rheumatology ASAP.  Thanks.

## 2023-05-07 ENCOUNTER — Ambulatory Visit: Payer: Commercial Managed Care - PPO | Admitting: Orthopaedic Surgery

## 2023-05-12 ENCOUNTER — Encounter: Payer: Self-pay | Admitting: Sports Medicine

## 2023-05-12 DIAGNOSIS — M25552 Pain in left hip: Secondary | ICD-10-CM | POA: Diagnosis not present

## 2023-05-13 ENCOUNTER — Other Ambulatory Visit: Payer: Self-pay | Admitting: Sports Medicine

## 2023-05-13 DIAGNOSIS — M25552 Pain in left hip: Secondary | ICD-10-CM

## 2023-05-14 ENCOUNTER — Other Ambulatory Visit: Payer: Self-pay | Admitting: Sports Medicine

## 2023-05-14 DIAGNOSIS — M25552 Pain in left hip: Secondary | ICD-10-CM

## 2023-05-15 ENCOUNTER — Ambulatory Visit
Admission: RE | Admit: 2023-05-15 | Discharge: 2023-05-15 | Disposition: A | Discharge status: Home / Self Care | Payer: Commercial Managed Care - PPO | Source: Ambulatory Visit | Attending: Sports Medicine | Admitting: Sports Medicine

## 2023-05-15 DIAGNOSIS — M25552 Pain in left hip: Secondary | ICD-10-CM | POA: Diagnosis not present

## 2023-05-15 DIAGNOSIS — M25452 Effusion, left hip: Secondary | ICD-10-CM | POA: Diagnosis not present

## 2023-05-20 LAB — SYNOVIAL FLUID ANALYSIS, COMPLETE
Basophils, %: 0 %
Eosinophils-Synovial: 0 % (ref 0–2)
Lymphocytes-Synovial Fld: 5 % (ref 0–74)
Monocyte/Macrophage: 0 % (ref 0–69)
Neutrophil, Synovial: 95 % — ABNORMAL HIGH (ref 0–24)
Synoviocytes, %: 0 % (ref 0–15)
WBC, Synovial: 34424 {cells}/uL — ABNORMAL HIGH (ref <150)

## 2023-05-20 LAB — ANAEROBIC AND AEROBIC CULTURE
AER RESULT:: NO GROWTH
MICRO NUMBER:: 16108378
MICRO NUMBER:: 16108379
SPECIMEN QUALITY:: ADEQUATE
SPECIMEN QUALITY:: ADEQUATE

## 2023-06-06 ENCOUNTER — Encounter (HOSPITAL_COMMUNITY): Payer: Self-pay | Admitting: Pharmacist

## 2023-06-06 ENCOUNTER — Other Ambulatory Visit (HOSPITAL_COMMUNITY): Payer: Self-pay

## 2023-06-11 ENCOUNTER — Other Ambulatory Visit (HOSPITAL_COMMUNITY): Payer: Self-pay

## 2023-06-11 DIAGNOSIS — M25552 Pain in left hip: Secondary | ICD-10-CM | POA: Diagnosis not present

## 2023-06-11 MED ORDER — MELOXICAM 15 MG PO TABS
15.0000 mg | ORAL_TABLET | Freq: Every day | ORAL | 2 refills | Status: DC | PRN
  Filled 2023-06-11: qty 30, 30d supply, fill #0
  Filled 2023-07-14: qty 30, 30d supply, fill #1
  Filled 2023-08-15: qty 30, 30d supply, fill #2

## 2023-06-12 ENCOUNTER — Other Ambulatory Visit (HOSPITAL_COMMUNITY): Payer: Self-pay

## 2023-06-12 ENCOUNTER — Other Ambulatory Visit: Payer: Self-pay

## 2023-06-13 DIAGNOSIS — M25552 Pain in left hip: Secondary | ICD-10-CM | POA: Diagnosis not present

## 2023-07-14 ENCOUNTER — Other Ambulatory Visit (HOSPITAL_COMMUNITY): Payer: Self-pay

## 2023-07-30 DIAGNOSIS — M25552 Pain in left hip: Secondary | ICD-10-CM | POA: Diagnosis not present

## 2023-08-03 DIAGNOSIS — M25552 Pain in left hip: Secondary | ICD-10-CM | POA: Diagnosis not present

## 2023-08-06 DIAGNOSIS — M25552 Pain in left hip: Secondary | ICD-10-CM | POA: Diagnosis not present

## 2023-08-11 ENCOUNTER — Encounter: Payer: Self-pay | Admitting: Allergy & Immunology

## 2023-08-11 MED ORDER — DOXYCYCLINE HYCLATE 100 MG PO TABS: 100.0000 mg | ORAL_TABLET | Freq: Two times a day (BID) | ORAL | 0 refills | Status: AC

## 2023-08-11 NOTE — Progress Notes (Signed)
 Patient called reporting an engorged tick bite present for 2-3 days. She does have some surrounding erythema, but no fevers thus far. I am going to send in doxycycline  for empiric treatment.

## 2023-08-15 ENCOUNTER — Other Ambulatory Visit (HOSPITAL_COMMUNITY): Payer: Self-pay

## 2023-08-15 DIAGNOSIS — M1612 Unilateral primary osteoarthritis, left hip: Secondary | ICD-10-CM | POA: Diagnosis not present

## 2023-09-25 NOTE — Progress Notes (Signed)
 Office Visit Note  Patient: Patricia Goodman             Date of Birth: [birth year]           MRN: [id number]             PCP: Patricia Senior, MD Referring: Patricia Kay CHRISTELLA, MD Visit Date: 10/09/2023 Occupation: @GUAROCC @  Subjective:  Left hip pain  History of Present Illness: Patricia Goodman is a 58 y.o. female seen for the evaluation of left hip joint pain and elevated inflammatory markers.  According to Patricia Goodman started about 5 years ago with intermittent left hip joint pain.  Patricia Goodman states Patricia Goodman took ibuprofen on a as needed basis.  In August 2024 Patricia Goodman started having increased left hip joint pain and nocturnal pain.  Patricia Goodman states Patricia Goodman had x-rays by Patricia Goodman PCP which were read as normal and Patricia Goodman inflammatory markers were normal.  Patricia Goodman states the pain gradually got even worse.  Patricia Goodman went to see Patricia Goodman in January 2025 who advised ultrasound-guided left hip joint injection by Patricia Goodman.  Patricia Goodman states right after the injection Patricia Goodman started having severe pain and discomfort.  Patricia Goodman had MRI of Patricia Goodman left hip joint in February 2025 which showed some tendinitis and moderate effusion in Patricia Goodman left hip joint.  The labs showed elevated sedimentation rate and CRP.  Patricia Goodman had left hip joint aspiration at [facility] imaging which showed elevated synovial fluid WBC count and elevated neutrophils.  The crystals were negative and the culture was negative.  Patricia Goodman went to [facility] for a second opinion where Patricia Goodman had a repeat MRI in May 2025 and the x-rays.  Patricia Goodman was told that Patricia Goodman had severe arthritis of Patricia Goodman hip joint and Patricia Goodman is scheduled for left total hip replacement on November 11, 2023.  Patricia Goodman states Patricia Goodman has been having some discomfort in Patricia Goodman right hip due to favoring Patricia Goodman left hip.  None of the other joints are painful.  Patricia Goodman had sprain in Patricia Goodman right ankle joint in high school which causes intermittent discomfort.  Patricia Goodman had 1 episode of plantar fasciitis about 5 years ago with no recurrence.  There is no history of Achilles tendinitis.  Patricia Goodman denies any  history of uveitis. 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  Patricia Goodman used to enjoy kayaking, hiking, reading and baking.  Patricia Goodman has not been very active recently.  Patricia Goodman has been doing seated circuits.  Patricia Goodman is married, right-handed, gravida 4, para 2, ectopic pregnancy 1, abortion 1.  There is no history of preeclampsia or DVTs.  Patricia Goodman drinks some wine over the weekends.  Patricia Goodman quit smoking in 2000.  Patricia Goodman smoked 2 cigarettes a day for about 10 years.  There is questionable family history of autoimmune disease in Patricia Goodman maternal aunt.    Activities of Daily Living:  Patient reports morning stiffness for  none.   Patient Reports nocturnal pain.  Difficulty dressing/grooming: Denies Difficulty climbing stairs: Reports Difficulty getting out of chair: Denies Difficulty using hands for taps, buttons, cutlery, and/or writing: Denies  Review of Systems  Constitutional:  Negative for fatigue.  HENT:  Negative for mouth sores and mouth dryness.   Eyes:  Negative for dryness.  Respiratory:  Negative for shortness of breath.   Cardiovascular:  Negative for chest pain and palpitations.  Gastrointestinal:  Negative for blood in stool, constipation and diarrhea.  Endocrine: Negative for increased urination.  Genitourinary:  Negative for involuntary urination.  Musculoskeletal:  Positive for  joint pain, joint pain and joint swelling. Negative for gait problem, myalgias, muscle weakness, morning stiffness, muscle tenderness and myalgias.  Skin:  Positive for hair loss. Negative for color change, rash and sensitivity to sunlight.  Allergic/Immunologic: Negative for susceptible to infections.  Neurological:  Negative for dizziness and headaches.  Hematological:  Negative for swollen glands.  Psychiatric/Behavioral:  Positive for sleep disturbance. Negative for depressed mood. The patient is nervous/anxious.     PMFS History:  There are no active problems to display for this patient.   Past Medical  History:  Diagnosis Date   Insomnia 2015   Osteoarthritis of left hip 03/2023    Family History  Problem Relation Age of Onset   Diabetes Mother        type 2   Kidney failure Mother        stage 3   Heart disease Father    Healthy Brother    Breast cancer Maternal Grandmother    Healthy Son    Healthy Daughter    Past Surgical History:  Procedure Laterality Date   ELBOW SURGERY Right 1997   Social History   Social History Narrative   Not on file   Immunization History  Administered Date(s) Administered   PFIZER Comirnaty (Gray Top)Covid-19 Tri-Sucrose Vaccine 10/04/2020   PFIZER(Purple Top)SARS-COV-2 Vaccination 12/17/2019   Pfizer Covid-19 Vaccine Bivalent Booster 51yrs & up 03/05/2021     Objective: Vital Signs: BP (!) 170/105 (BP Location: Right Arm, Patient Position: Sitting, Cuff Size: Normal)   Pulse 77   Resp 15   Ht 5' 6.5 (1.689 m)   Wt 212 lb 3.2 oz (96.3 kg)   BMI 33.74 kg/m    Physical Exam Vitals and nursing note reviewed.  Constitutional:      Appearance: Patricia Goodman is well-developed.  HENT:     Head: Normocephalic and atraumatic.  Eyes:     Conjunctiva/sclera: Conjunctivae normal.  Cardiovascular:     Rate and Rhythm: Normal rate and regular rhythm.     Heart sounds: Normal heart sounds.  Pulmonary:     Effort: Pulmonary effort is normal.     Breath sounds: Normal breath sounds.  Abdominal:     General: Bowel sounds are normal.     Palpations: Abdomen is soft.  Musculoskeletal:     Cervical back: Normal range of motion.  Lymphadenopathy:     Cervical: No cervical adenopathy.  Skin:    General: Skin is warm and dry.     Capillary Refill: Capillary refill takes less than 2 seconds.  Neurological:     Mental Status: Patricia Goodman is alert and oriented to person, place, and time.  Psychiatric:        Behavior: Behavior normal.      Musculoskeletal Exam: Cervical, thoracic and lumbar spine with good range of motion.  Shoulder joints, elbow joints,  wrist joints, MCPs PIPs and DIPs with good range of motion with no synovitis.  Patricia Goodman had very limited painful range of motion of the left hip joint.  Right hip joint was in full range of motion.  Knee joints with good range of motion without rebound swelling or effusion.  There was no tenderness over Patricia Goodman ankles or MTPs.  Patricia Goodman had bilateral pes cavus.  No plantar fasciitis or Achilles tendinitis was noted.  CDAI Exam: CDAI Score: -- Patient Global: --; Provider Global: -- Swollen: --; Tender: -- Joint Exam 10/09/2023   No joint exam has been documented for this visit   There is currently no  information documented on the homunculus. Go to the Rheumatology activity and complete the homunculus joint exam.  Investigation: No additional findings.  Imaging: No results found.  Recent Labs: Lab Results  Component Value Date   WBC 14.3 (H) 05/02/2023   HGB 14.6 05/02/2023   PLT 467 (H) 05/02/2023   May 02, 2023 ANA 1: 40 NS, ESR 89, CRP 81.1, RF negative, anti-CCP negative May 15, 2023 synovial fluid WBC 34,424 95% neutrophils, crystals negative, culture negative Speciality Comments: No specialty comments available.  Procedures:  No procedures performed Allergies: Ciprofloxacin   Assessment / Plan:     Visit Diagnoses: Pain in left hip-Patricia Goodman has been having gradually increasing pain in Patricia Goodman left hip joint for the last 5 years.  Patricia Goodman states the pain got worse and in August 2024.  Patricia Goodman had left hip joint injection in January 2025 after that Patricia Goodman got progressively increasing severe pain.  MRI of the hip joint worsened with pain February 2025 till May 2025.  Patricia Goodman states Patricia Goodman most recent x-rays showed severe left hip joint osteoarthritis which is noted as rapidly progressive arthritis of the left hip joint.  The synovial fluid was inflammatory, the questions were negative and the culture was negative.  Patricia Goodman inflammatory markers were elevated.  All autoimmune workup was negative.  None of the other  joints are inflamed.  All previous x-rays, MRI and lab analysis were reviewed.  There is no history of plantar fasciitis, Achilles tendinitis or uveitis.  There is no personal or family history of psoriasis.  Patient is scheduled to have left total hip replacement in a month.  I did detailed discussion with the patient.  As Patricia Goodman has only 1 joint involvement I would hold off immunosuppressive agents at this time.  Will see response to the left total hip replacement.  If Patricia Goodman has involvement of other joints in the future then we may consider immunosuppressive agents.  Inflammatory arthritis -Patricia Goodman has been experiencing pain and inflammation in Patricia Goodman left hip joint for the last several months.  I will repeat some of the lab work.  Plan: Rheumatoid factor, Cyclic citrul peptide antibody, IgG, Uric acid, Mutated Citrullinated Vimentin (MCV) Antibody, Angiotensin converting enzyme, HLA-B27 antigen, Sedimentation rate, C-reactive protein.  Patient plans to return for lab work tomorrow.  Elevated sed rate  Primary insomnia-Patricia Goodman takes Ambien  on as needed basis.  Former smoker - Quit 2000, smoked 2 cig/dat x 10years  Orders: Orders Placed This Encounter  Procedures   Rheumatoid factor   Cyclic citrul peptide antibody, IgG   Uric acid   Mutated Citrullinated Vimentin (MCV) Antibody   Angiotensin converting enzyme   HLA-B27 antigen   Sedimentation rate   C-reactive protein   No orders of the defined types were placed in this encounter.    Follow-Up Instructions: Return in about 3 months (around 01/09/2024) for Inflammatory arthritis.   Maya Nash, MD  Note - This record has been created using Animal nutritionist.  Chart creation errors have been sought, but may not always  have been located. Such creation errors do not reflect on  the standard of medical care.

## 2023-10-09 ENCOUNTER — Encounter: Payer: Self-pay | Admitting: Rheumatology

## 2023-10-09 ENCOUNTER — Ambulatory Visit: Payer: Commercial Managed Care - PPO | Attending: Rheumatology | Admitting: Rheumatology

## 2023-10-09 VITALS — BP 170/105 | HR 77 | Resp 15 | Ht 66.5 in | Wt 212.2 lb

## 2023-10-09 DIAGNOSIS — M1990 Unspecified osteoarthritis, unspecified site: Secondary | ICD-10-CM | POA: Diagnosis not present

## 2023-10-09 DIAGNOSIS — F5101 Primary insomnia: Secondary | ICD-10-CM

## 2023-10-09 DIAGNOSIS — Z87891 Personal history of nicotine dependence: Secondary | ICD-10-CM

## 2023-10-09 DIAGNOSIS — M25552 Pain in left hip: Secondary | ICD-10-CM | POA: Diagnosis not present

## 2023-10-09 DIAGNOSIS — R700 Elevated erythrocyte sedimentation rate: Secondary | ICD-10-CM | POA: Diagnosis not present

## 2023-10-16 ENCOUNTER — Other Ambulatory Visit: Payer: Self-pay | Admitting: *Deleted

## 2023-10-16 DIAGNOSIS — M1990 Unspecified osteoarthritis, unspecified site: Secondary | ICD-10-CM | POA: Diagnosis not present

## 2023-10-16 DIAGNOSIS — R700 Elevated erythrocyte sedimentation rate: Secondary | ICD-10-CM | POA: Diagnosis not present

## 2023-10-16 DIAGNOSIS — M25552 Pain in left hip: Secondary | ICD-10-CM | POA: Diagnosis not present

## 2023-10-20 ENCOUNTER — Ambulatory Visit: Payer: Self-pay | Admitting: Rheumatology

## 2023-10-20 DIAGNOSIS — M1612 Unilateral primary osteoarthritis, left hip: Secondary | ICD-10-CM | POA: Diagnosis not present

## 2023-10-20 LAB — ANGIOTENSIN CONVERTING ENZYME: Angiotensin-Converting Enzyme: 35 U/L (ref 9–67)

## 2023-10-20 LAB — HLA-B27 ANTIGEN: HLA-B27 Antigen: POSITIVE — AB

## 2023-10-20 LAB — C-REACTIVE PROTEIN: CRP: <3 mg/L (ref <8.0)

## 2023-10-20 LAB — CYCLIC CITRUL PEPTIDE ANTIBODY, IGG: Cyclic Citrullin Peptide Ab: <16 U

## 2023-10-20 LAB — RHEUMATOID FACTOR: Rheumatoid fact SerPl-aCnc: <10 [IU]/mL (ref <14)

## 2023-10-20 LAB — SEDIMENTATION RATE: Sed Rate: 2 mm/h (ref 0–30)

## 2023-10-20 LAB — URIC ACID: Uric Acid, Serum: 4.4 mg/dL (ref 2.5–7.0)

## 2023-10-20 LAB — MUTATED CITRULLINATED VIMENTIN (MCV) ANTIBODY: MUTATED CITRULLINATED VIMENTIN (MCV) AB: <20 U/mL (ref <20)

## 2023-10-20 NOTE — Progress Notes (Signed)
 I will discuss results at the follow-up visit.

## 2023-10-21 DIAGNOSIS — M1612 Unilateral primary osteoarthritis, left hip: Secondary | ICD-10-CM | POA: Diagnosis not present

## 2023-10-21 DIAGNOSIS — Z6834 Body mass index (BMI) 34.0-34.9, adult: Secondary | ICD-10-CM | POA: Diagnosis not present

## 2023-10-21 DIAGNOSIS — R7303 Prediabetes: Secondary | ICD-10-CM | POA: Diagnosis not present

## 2023-10-28 NOTE — Progress Notes (Addendum)
 Anesthesia Review:  PCP: Chiquita Maize Called and requested most recent ov note and CMP result on 11/03/23.  Cardiologist : none   PPM/ ICD: Device Orders: Rep Notified:  Chest x-ray : EKG : Echo : Stress test: Cardiac Cath :   Activity level: can do a flight of stairs without difficutly  Sleep Study/ CPAP : none  Fasting Blood Sugar :      / Checks Blood Sugar -- times a day:    Blood Thinner/ Instructions /Last Dose: ASA / Instructions/ Last Dose :

## 2023-10-29 NOTE — Patient Instructions (Signed)
 SURGICAL WAITING ROOM VISITATION  Patients having surgery or a procedure may have no more than 2 support people in the waiting area - these visitors may rotate.    Children under the age of 23 must have an adult with them who is not the patient.  Visitors with respiratory illnesses are discouraged from visiting and should remain at home.  If the patient needs to stay at the hospital during part of their recovery, the visitor guidelines for inpatient rooms apply. Pre-op nurse will coordinate an appropriate time for 1 support person to accompany patient in pre-op.  This support person may not rotate.    Please refer to the [facility] website for the visitor guidelines for Inpatients (after your surgery is over and you are in a regular room).       Your procedure is scheduled on:  11/11/2023    [facility]    Report to admitting at  0900 AM   Call this number if you have problems the morning of surgery [phone]   Do not eat food :After Midnight.   After Midnight you may have the following liquids until __ 0830____ AM DAY OF SURGERY  Water Non-Citrus Juices (without pulp, NO RED-Apple, White grape, White cranberry) Black Coffee (NO MILK/CREAM OR CREAMERS, sugar ok)  Clear Tea (NO MILK/CREAM OR CREAMERS, sugar ok) regular and decaf                             Plain Jell-O (NO RED)                                           Fruit ices (not with fruit pulp, NO RED)                                     Popsicles (NO RED)                                                               Sports drinks like Gatorade (NO RED)                   The day of surgery:  Drink ONE (1) Pre-Surgery Clear Ensure or G2 at 0830 AM the morning of surgery. Drink in one sitting. Do not sip.  This drink was given to you during your hospital  pre-op appointment visit. Nothing else to drink after completing the  Pre-Surgery Clear Ensure or G2.          If you have  questions, please contact your surgeon's office.       Oral Hygiene is also important to reduce your risk of infection.                                    Remember - BRUSH YOUR TEETH THE MORNING OF SURGERY WITH YOUR REGULAR TOOTHPASTE  DENTURES WILL BE REMOVED PRIOR TO SURGERY PLEASE DO NOT APPLY Poly grip OR ADHESIVES!!!  Do NOT smoke after Midnight   Stop all vitamins and herbal supplements 7 days before surgery.   Take these medicines the morning of surgery with A SIP OF WATER:  none   DO NOT TAKE ANY ORAL DIABETIC MEDICATIONS DAY OF YOUR SURGERY  Bring CPAP mask and tubing day of surgery.                              You may not have any metal on your body including hair pins, jewelry, and body piercing             Do not wear make-up, lotions, powders, perfumes/cologne, or deodorant  Do not wear nail polish including gel and S&S, artificial/acrylic nails, or any other type of covering on natural nails including finger and toenails. If you have artificial nails, gel coating, etc. that needs to be removed by a nail salon please have this removed prior to surgery or surgery may need to be canceled/ delayed if the surgeon/ anesthesia feels like they are unable to be safely monitored.   Do not shave  48 hours prior to surgery.               Men may shave face and neck.   Do not bring valuables to the hospital. [facility] IS NOT             RESPONSIBLE   FOR VALUABLES.   Contacts, glasses, dentures or bridgework may not be worn into surgery.   Bring small overnight bag day of surgery.   DO NOT BRING YOUR HOME MEDICATIONS TO THE HOSPITAL. PHARMACY WILL DISPENSE MEDICATIONS LISTED ON YOUR MEDICATION LIST TO YOU DURING YOUR ADMISSION IN THE HOSPITAL!    Patients discharged on the day of surgery will not be allowed to drive home.  Someone NEEDS to stay with you for the first 24 hours after anesthesia.   Special Instructions: Bring a copy of your healthcare power of attorney  and living will documents the day of surgery if you haven't scanned them before.              Please read over the following fact sheets you were given: IF YOU HAVE QUESTIONS ABOUT YOUR PRE-OP INSTRUCTIONS PLEASE CALL [phone].   If you received a COVID test during your pre-op visit  it is requested that you wear a mask when out in public, stay away from anyone that may not be feeling well and notify your surgeon if you develop symptoms. If you test positive for Covid or have been in contact with anyone that has tested positive in the last 10 days please notify you surgeon.      Pre-operative 5 CHG Bath Instructions   You can play a key role in reducing the risk of infection after surgery. Your skin needs to be as free of germs as possible. You can reduce the number of germs on your skin by washing with CHG (chlorhexidine gluconate) soap before surgery. CHG is an antiseptic soap that kills germs and continues to kill germs even after washing.   DO NOT use if you have an allergy to chlorhexidine/CHG or antibacterial soaps. If your skin becomes reddened or irritated, stop using the CHG and notify one of our RNs at [phone].   Please shower with the CHG soap starting 4 days before surgery using the following schedule:     Please keep in mind the following:  DO NOT  shave, including legs and underarms, starting the day of your first shower.   You may shave your face at any point before/day of surgery.  Place clean sheets on your bed the day you start using CHG soap. Use a clean washcloth (not used since being washed) for each shower. DO NOT sleep with pets once you start using the CHG.   CHG Shower Instructions:  If you choose to wash your hair and private area, wash first with your normal shampoo/soap.  After you use shampoo/soap, rinse your hair and body thoroughly to remove shampoo/soap residue.  Turn the water OFF and apply about 3 tablespoons (45 ml) of CHG soap to a CLEAN washcloth.   Apply CHG soap ONLY FROM YOUR NECK DOWN TO YOUR TOES (washing for 3-5 minutes)  DO NOT use CHG soap on face, private areas, open wounds, or sores.  Pay special attention to the area where your surgery is being performed.  If you are having back surgery, having someone wash your back for you may be helpful. Wait 2 minutes after CHG soap is applied, then you may rinse off the CHG soap.  Pat dry with a clean towel  Put on clean clothes/pajamas   If you choose to wear lotion, please use ONLY the CHG-compatible lotions on the back of this paper.     Additional instructions for the day of surgery: DO NOT APPLY any lotions, deodorants, cologne, or perfumes.   Put on clean/comfortable clothes.  Brush your teeth.  Ask your nurse before applying any prescription medications to the skin.      CHG Compatible Lotions   Aveeno Moisturizing lotion  Cetaphil Moisturizing Cream  Cetaphil Moisturizing Lotion  Clairol Herbal Essence Moisturizing Lotion, Dry Skin  Clairol Herbal Essence Moisturizing Lotion, Extra Dry Skin  Clairol Herbal Essence Moisturizing Lotion, Normal Skin  Curel Age Defying Therapeutic Moisturizing Lotion with Alpha Hydroxy  Curel Extreme Care Body Lotion  Curel Soothing Hands Moisturizing Hand Lotion  Curel Therapeutic Moisturizing Cream, Fragrance-Free  Curel Therapeutic Moisturizing Lotion, Fragrance-Free  Curel Therapeutic Moisturizing Lotion, Original Formula  Eucerin Daily Replenishing Lotion  Eucerin Dry Skin Therapy Plus Alpha Hydroxy Crme  Eucerin Dry Skin Therapy Plus Alpha Hydroxy Lotion  Eucerin Original Crme  Eucerin Original Lotion  Eucerin Plus Crme Eucerin Plus Lotion  Eucerin TriLipid Replenishing Lotion  Keri Anti-Bacterial Hand Lotion  Keri Deep Conditioning Original Lotion Dry Skin Formula Softly Scented  Keri Deep Conditioning Original Lotion, Fragrance Free Sensitive Skin Formula  Keri Lotion Fast Absorbing Fragrance Free Sensitive Skin  Formula  Keri Lotion Fast Absorbing Softly Scented Dry Skin Formula  Keri Original Lotion  Keri Skin Renewal Lotion Keri Silky Smooth Lotion  Keri Silky Smooth Sensitive Skin Lotion  Nivea Body Creamy Conditioning Oil  Nivea Body Extra Enriched Teacher, adult education Moisturizing Lotion Nivea Crme  Nivea Skin Firming Lotion  NutraDerm 30 Skin Lotion  NutraDerm Skin Lotion  NutraDerm Therapeutic Skin Cream  NutraDerm Therapeutic Skin Lotion  ProShield Protective Hand Cream  Provon moisturizing lotion

## 2023-11-03 ENCOUNTER — Other Ambulatory Visit: Payer: Self-pay

## 2023-11-03 ENCOUNTER — Encounter (HOSPITAL_COMMUNITY)
Admission: RE | Admit: 2023-11-03 | Discharge: 2023-11-03 | Disposition: A | Discharge status: Home / Self Care | Source: Ambulatory Visit | Attending: Orthopedic Surgery | Admitting: Orthopedic Surgery

## 2023-11-03 ENCOUNTER — Encounter (HOSPITAL_COMMUNITY): Payer: Self-pay

## 2023-11-03 VITALS — BP 147/80 | HR 79 | Temp 98.4°F | Resp 16 | Ht 66.0 in | Wt 211.0 lb

## 2023-11-03 DIAGNOSIS — Z01812 Encounter for preprocedural laboratory examination: Secondary | ICD-10-CM | POA: Diagnosis present

## 2023-11-03 DIAGNOSIS — M1612 Unilateral primary osteoarthritis, left hip: Secondary | ICD-10-CM | POA: Diagnosis not present

## 2023-11-03 DIAGNOSIS — Z01818 Encounter for other preprocedural examination: Secondary | ICD-10-CM

## 2023-11-03 LAB — CBC
HCT: 44.4 % (ref 36.0–46.0)
Hemoglobin: 14.3 g/dL (ref 12.0–15.0)
MCH: 29.7 pg (ref 26.0–34.0)
MCHC: 32.2 g/dL (ref 30.0–36.0)
MCV: 92.1 fL (ref 80.0–100.0)
Platelets: 277 10*3/uL (ref 150–400)
RBC: 4.82 MIL/uL (ref 3.87–5.11)
RDW: 12.5 % (ref 11.5–15.5)
WBC: 5.4 10*3/uL (ref 4.0–10.5)
nRBC: 0 % (ref 0.0–0.2)

## 2023-11-03 LAB — SURGICAL PCR SCREEN
MRSA, PCR: NEGATIVE
Staphylococcus aureus: NEGATIVE

## 2023-11-04 NOTE — H&P (Signed)
 TOTAL HIP ADMISSION H&P  Patient is admitted for left total hip arthroplasty.  Therapy Plans: HEP Disposition: Home with husband (daughter to help) Planned DVT Prophylaxis: aspirin  81mg  BID DME needed: walker PCP: Chiquita Maize - appointment tomorrow TXA: IV Allergies: cipro - hives Anesthesia Concerns: none BMI: 33.7 Last HgbA1c: Not diabetic  Other: - Prefers SDD - No hx of VTE or cancer - oxycodone , robaxin , tylenol , ibuprofen  Subjective:  Chief Complaint: left hip pain  HPI: Patricia Goodman, 58 y.o. female, has a history of pain and functional disability in the left hip(s) due to arthritis and patient has failed non-surgical conservative treatments for greater than 12 weeks to include NSAID's and/or analgesics and activity modification.  Onset of symptoms was gradual starting 2 years ago with gradually worsening course since that time.The patient noted no past surgery on the left hip(s).  Patient currently rates pain in the left hip at 8 out of 10 with activity. Patient has worsening of pain with activity and weight bearing, pain that interfers with activities of daily living, and pain with passive range of motion. Patient has evidence of joint space narrowing by imaging studies. This condition presents safety issues increasing the risk of falls.  There is no current active infection.  There are no active problems to display for this patient.  Past Medical History:  Diagnosis Date   Insomnia 2015   Osteoarthritis of left hip 03/2023    Past Surgical History:  Procedure Laterality Date   ELBOW SURGERY Right 1997    No current facility-administered medications for this encounter.   Current Outpatient Medications  Medication Sig Dispense Refill Last Dose/Taking   ibuprofen (ADVIL) 200 MG tablet Take 400 mg by mouth daily as needed (pain.).   Taking As Needed   zolpidem  (AMBIEN ) 10 MG tablet Take one tablet by mouth daily as needed at bedtime. (Patient taking differently: Take  10 mg by mouth at bedtime as needed for sleep.) 30 tablet 1 Taking Differently   Allergies  Allergen Reactions   Ciprofloxacin Hives    Social History   Tobacco Use   Smoking status: Former    Current packs/day: 0.00    Types: Cigarettes    Quit date: 2000    Years since quitting: 25.6    Passive exposure: Past   Smokeless tobacco: Never  Substance Use Topics   Alcohol use: Yes    Comment: occ on weekends    Family History  Problem Relation Age of Onset   Diabetes Mother        type 2   Kidney failure Mother        stage 3   Heart disease Father    Healthy Brother    Breast cancer Maternal Grandmother    Healthy Son    Healthy Daughter      Review of Systems  Constitutional:  Negative for chills and fever.  Respiratory:  Negative for cough and shortness of breath.   Cardiovascular:  Negative for chest pain.  Gastrointestinal:  Negative for nausea and vomiting.  Musculoskeletal:  Positive for arthralgias.     Objective:  Physical Exam Well nourished and well developed. General: Alert and oriented x3, cooperative and pleasant, no acute distress.  Right hip: No lateral tenderness No pain with passive hip ROM Left Hip: No lateral tenderness Significant reproducible groin pain with hip flexion and internal rotation in particular   Vital signs in last 24 hours:    Labs:   Estimated body mass index  is 34.06 kg/m as calculated from the following:   Height as of 11/03/23: 5' 6 (1.676 m).   Weight as of 11/03/23: 95.7 kg.   Imaging Review Plain radiographs demonstrate severe degenerative joint disease of the left hip(s). The bone quality appears to be adequate for age and reported activity level.      Assessment/Plan:  End stage arthritis, left hip(s)  The patient history, physical examination, clinical judgement of the provider and imaging studies are consistent with end stage degenerative joint disease of the left hip(s) and total hip arthroplasty  is deemed medically necessary. The treatment options including medical management, injection therapy, arthroscopy and arthroplasty were discussed at length. The risks and benefits of total hip arthroplasty were presented and reviewed. The risks due to aseptic loosening, infection, stiffness, dislocation/subluxation,  thromboembolic complications and other imponderables were discussed.  The patient acknowledged the explanation, agreed to proceed with the plan and consent was signed. Patient is being admitted for inpatient treatment for surgery, pain control, PT, OT, prophylactic antibiotics, VTE prophylaxis, progressive ambulation and ADL's and discharge planning.The patient is planning to be discharged home.   Rosina Calin, PA-C Orthopedic Surgery [facility] [place] [phone]

## 2023-11-06 ENCOUNTER — Ambulatory Visit: Payer: Commercial Managed Care - PPO | Admitting: Rheumatology

## 2023-11-11 ENCOUNTER — Ambulatory Visit (HOSPITAL_COMMUNITY)
Admission: RE | Admit: 2023-11-11 | Discharge: 2023-11-11 | Disposition: A | Discharge status: Home / Self Care | Source: Ambulatory Visit | Attending: Orthopedic Surgery | Admitting: Orthopedic Surgery

## 2023-11-11 ENCOUNTER — Other Ambulatory Visit: Payer: Self-pay

## 2023-11-11 ENCOUNTER — Ambulatory Visit (HOSPITAL_COMMUNITY)

## 2023-11-11 ENCOUNTER — Ambulatory Visit (HOSPITAL_BASED_OUTPATIENT_CLINIC_OR_DEPARTMENT_OTHER): Admitting: Anesthesiology

## 2023-11-11 ENCOUNTER — Encounter (HOSPITAL_COMMUNITY): Payer: Self-pay | Admitting: Orthopedic Surgery

## 2023-11-11 ENCOUNTER — Encounter (HOSPITAL_COMMUNITY)
Admission: RE | Disposition: A | Discharge status: Home / Self Care | Payer: Self-pay | Source: Ambulatory Visit | Attending: Orthopedic Surgery

## 2023-11-11 ENCOUNTER — Ambulatory Visit (HOSPITAL_COMMUNITY): Payer: Self-pay | Admitting: Medical

## 2023-11-11 DIAGNOSIS — M1612 Unilateral primary osteoarthritis, left hip: Secondary | ICD-10-CM | POA: Insufficient documentation

## 2023-11-11 DIAGNOSIS — Z96642 Presence of left artificial hip joint: Secondary | ICD-10-CM

## 2023-11-11 DIAGNOSIS — Z87891 Personal history of nicotine dependence: Secondary | ICD-10-CM | POA: Insufficient documentation

## 2023-11-11 DIAGNOSIS — Z01818 Encounter for other preprocedural examination: Secondary | ICD-10-CM

## 2023-11-11 DIAGNOSIS — Z471 Aftercare following joint replacement surgery: Secondary | ICD-10-CM | POA: Diagnosis not present

## 2023-11-11 HISTORY — PX: TOTAL HIP ARTHROPLASTY: SHX124

## 2023-11-11 LAB — TYPE AND SCREEN
ABO/RH(D): O POS
Antibody Screen: NEGATIVE

## 2023-11-11 LAB — ABO/RH: ABO/RH(D): O POS

## 2023-11-11 SURGERY — ARTHROPLASTY, HIP, TOTAL, ANTERIOR APPROACH
Anesthesia: Monitor Anesthesia Care | Site: Hip | Laterality: Left

## 2023-11-11 MED ORDER — MIDAZOLAM HCL 5 MG/5ML IJ SOLN
INTRAMUSCULAR | Status: DC | PRN
  Administered 2023-11-11: 2 mg via INTRAVENOUS

## 2023-11-11 MED ORDER — METHOCARBAMOL 500 MG PO TABS: 500.0000 mg | ORAL_TABLET | Freq: Four times a day (QID) | ORAL | 1 refills | Status: AC | PRN

## 2023-11-11 MED ORDER — OXYCODONE HCL 5 MG PO TABS: 5.0000 mg | ORAL_TABLET | ORAL | Status: DC | PRN

## 2023-11-11 MED ORDER — TRANEXAMIC ACID-NACL 1000-0.7 MG/100ML-% IV SOLN
1000.0000 mg | INTRAVENOUS | Status: AC
  Administered 2023-11-11: 1000 mg via INTRAVENOUS
  Filled 2023-11-11: qty 100

## 2023-11-11 MED ORDER — PHENYLEPHRINE HCL-NACL 20-0.9 MG/250ML-% IV SOLN
INTRAVENOUS | Status: DC | PRN
  Administered 2023-11-11: 40 ug/min via INTRAVENOUS

## 2023-11-11 MED ORDER — PROPOFOL 10 MG/ML IV BOLUS
INTRAVENOUS | Status: DC | PRN
  Administered 2023-11-11 (×2): 20 mg via INTRAVENOUS
  Administered 2023-11-11: 30 mg via INTRAVENOUS
  Administered 2023-11-11: 50 mg via INTRAVENOUS
  Administered 2023-11-11: 10 mg via INTRAVENOUS

## 2023-11-11 MED ORDER — LACTATED RINGERS IV SOLN: INTRAVENOUS | Status: DC

## 2023-11-11 MED ORDER — CEFAZOLIN SODIUM-DEXTROSE 2-4 GM/100ML-% IV SOLN
INTRAVENOUS | Status: AC
Start: 2023-11-11 — End: 2023-11-11
  Filled 2023-11-11: qty 100

## 2023-11-11 MED ORDER — SENNA 8.6 MG PO TABS: 1.0000 | ORAL_TABLET | Freq: Every day | ORAL | 0 refills | Status: AC

## 2023-11-11 MED ORDER — STERILE WATER FOR IRRIGATION IR SOLN
Status: DC | PRN
  Administered 2023-11-11: 2000 mL

## 2023-11-11 MED ORDER — OXYCODONE HCL 5 MG PO TABS
5.0000 mg | ORAL_TABLET | Freq: Once | ORAL | Status: AC | PRN
  Administered 2023-11-11: 5 mg via ORAL

## 2023-11-11 MED ORDER — SODIUM CHLORIDE (PF) 0.9 % IJ SOLN
INTRAMUSCULAR | Status: DC | PRN
  Administered 2023-11-11: 61 mL

## 2023-11-11 MED ORDER — ASPIRIN 81 MG PO CHEW
81.0000 mg | CHEWABLE_TABLET | Freq: Two times a day (BID) | ORAL | 0 refills | Status: AC
Start: 2023-11-11 — End: 2023-12-09

## 2023-11-11 MED ORDER — TRANEXAMIC ACID-NACL 1000-0.7 MG/100ML-% IV SOLN
1000.0000 mg | Freq: Once | INTRAVENOUS | Status: AC
  Administered 2023-11-11: 1000 mg via INTRAVENOUS

## 2023-11-11 MED ORDER — POLYETHYLENE GLYCOL 3350 17 G PO PACK: 17.0000 g | PACK | Freq: Two times a day (BID) | ORAL | Status: AC

## 2023-11-11 MED ORDER — DEXAMETHASONE SODIUM PHOSPHATE 10 MG/ML IJ SOLN
INTRAMUSCULAR | Status: DC | PRN
  Administered 2023-11-11: 10 mg via INTRAVENOUS

## 2023-11-11 MED ORDER — TRANEXAMIC ACID-NACL 1000-0.7 MG/100ML-% IV SOLN
INTRAVENOUS | Status: AC
  Filled 2023-11-11: qty 100

## 2023-11-11 MED ORDER — DEXAMETHASONE SODIUM PHOSPHATE 10 MG/ML IJ SOLN: 8.0000 mg | Freq: Once | INTRAMUSCULAR | Status: DC

## 2023-11-11 MED ORDER — METHOCARBAMOL 500 MG PO TABS: 500.0000 mg | ORAL_TABLET | Freq: Four times a day (QID) | ORAL | Status: DC | PRN

## 2023-11-11 MED ORDER — MIDAZOLAM HCL 2 MG/2ML IJ SOLN
INTRAMUSCULAR | Status: AC
  Filled 2023-11-11: qty 2

## 2023-11-11 MED ORDER — OXYCODONE HCL 5 MG/5ML PO SOLN: 5.0000 mg | Freq: Once | ORAL | Status: AC | PRN

## 2023-11-11 MED ORDER — ONDANSETRON HCL 4 MG/2ML IJ SOLN
INTRAMUSCULAR | Status: DC | PRN
  Administered 2023-11-11: 4 mg via INTRAVENOUS

## 2023-11-11 MED ORDER — POVIDONE-IODINE 10 % EX SWAB: 2.0000 | Freq: Once | CUTANEOUS | Status: DC

## 2023-11-11 MED ORDER — PROPOFOL 1000 MG/100ML IV EMUL
INTRAVENOUS | Status: AC
  Filled 2023-11-11: qty 100

## 2023-11-11 MED ORDER — METHOCARBAMOL 1000 MG/10ML IJ SOLN: 500.0000 mg | Freq: Four times a day (QID) | INTRAMUSCULAR | Status: DC | PRN

## 2023-11-11 MED ORDER — 0.9 % SODIUM CHLORIDE (POUR BTL) OPTIME
TOPICAL | Status: DC | PRN
  Administered 2023-11-11: 1000 mL

## 2023-11-11 MED ORDER — OXYCODONE HCL 5 MG PO TABS: 5.0000 mg | ORAL_TABLET | ORAL | 0 refills | Status: AC | PRN

## 2023-11-11 MED ORDER — ACETAMINOPHEN 500 MG PO TABS: 1000.0000 mg | ORAL_TABLET | Freq: Four times a day (QID) | ORAL | Status: DC

## 2023-11-11 MED ORDER — HYDROMORPHONE HCL 1 MG/ML IJ SOLN: 0.5000 mg | INTRAMUSCULAR | Status: DC | PRN

## 2023-11-11 MED ORDER — PROPOFOL 500 MG/50ML IV EMUL
INTRAVENOUS | Status: AC
  Filled 2023-11-11: qty 50

## 2023-11-11 MED ORDER — ORAL CARE MOUTH RINSE: 15.0000 mL | Freq: Once | OROMUCOSAL | Status: AC

## 2023-11-11 MED ORDER — CEFAZOLIN SODIUM-DEXTROSE 2-4 GM/100ML-% IV SOLN
2.0000 g | Freq: Four times a day (QID) | INTRAVENOUS | Status: DC
  Administered 2023-11-11: 2 g via INTRAVENOUS

## 2023-11-11 MED ORDER — LACTATED RINGERS IV SOLN
INTRAVENOUS | Status: DC | PRN
Start: 2023-11-11 — End: 2023-11-11

## 2023-11-11 MED ORDER — LACTATED RINGERS IV BOLUS
500.0000 mL | Freq: Once | INTRAVENOUS | Status: AC
  Administered 2023-11-11: 500 mL via INTRAVENOUS

## 2023-11-11 MED ORDER — CEFAZOLIN SODIUM-DEXTROSE 2-4 GM/100ML-% IV SOLN
2.0000 g | INTRAVENOUS | Status: AC
Start: 2023-11-11 — End: 2023-11-11
  Administered 2023-11-11: 2 g via INTRAVENOUS
  Filled 2023-11-11: qty 100

## 2023-11-11 MED ORDER — PROPOFOL 500 MG/50ML IV EMUL
INTRAVENOUS | Status: DC | PRN
  Administered 2023-11-11: 120 ug/kg/min via INTRAVENOUS

## 2023-11-11 MED ORDER — OXYCODONE HCL 5 MG PO TABS: 10.0000 mg | ORAL_TABLET | ORAL | Status: DC | PRN

## 2023-11-11 MED ORDER — BUPIVACAINE-EPINEPHRINE (PF) 0.25% -1:200000 IJ SOLN
INTRAMUSCULAR | Status: AC
  Filled 2023-11-11: qty 30

## 2023-11-11 MED ORDER — FENTANYL CITRATE PF 50 MCG/ML IJ SOSY: 25.0000 ug | PREFILLED_SYRINGE | INTRAMUSCULAR | Status: DC | PRN

## 2023-11-11 MED ORDER — ONDANSETRON HCL 4 MG/2ML IJ SOLN: 4.0000 mg | Freq: Four times a day (QID) | INTRAMUSCULAR | Status: DC | PRN

## 2023-11-11 MED ORDER — SODIUM CHLORIDE (PF) 0.9 % IJ SOLN
INTRAMUSCULAR | Status: AC
  Filled 2023-11-11: qty 30

## 2023-11-11 MED ORDER — PHENYLEPHRINE 80 MCG/ML (10ML) SYRINGE FOR IV PUSH (FOR BLOOD PRESSURE SUPPORT)
PREFILLED_SYRINGE | INTRAVENOUS | Status: DC | PRN
Start: 2023-11-11 — End: 2023-11-11
  Administered 2023-11-11: 160 ug via INTRAVENOUS
  Administered 2023-11-11: 80 ug via INTRAVENOUS
  Administered 2023-11-11: 160 ug via INTRAVENOUS
  Administered 2023-11-11: 80 ug via INTRAVENOUS

## 2023-11-11 MED ORDER — OXYCODONE HCL 5 MG PO TABS
ORAL_TABLET | ORAL | Status: AC
Start: 2023-11-11 — End: 2023-11-11
  Filled 2023-11-11: qty 1

## 2023-11-11 MED ORDER — KETOROLAC TROMETHAMINE 30 MG/ML IJ SOLN
INTRAMUSCULAR | Status: AC
  Filled 2023-11-11: qty 1

## 2023-11-11 MED ORDER — ONDANSETRON HCL 4 MG/2ML IJ SOLN
INTRAMUSCULAR | Status: AC
  Filled 2023-11-11: qty 2

## 2023-11-11 MED ORDER — CHLORHEXIDINE GLUCONATE 0.12 % MT SOLN
15.0000 mL | Freq: Once | OROMUCOSAL | Status: AC
  Administered 2023-11-11: 15 mL via OROMUCOSAL

## 2023-11-11 MED ORDER — DEXAMETHASONE SODIUM PHOSPHATE 10 MG/ML IJ SOLN
INTRAMUSCULAR | Status: AC
  Filled 2023-11-11: qty 1

## 2023-11-11 SURGICAL SUPPLY — 38 items
BAG COUNTER SPONGE SURGICOUNT (BAG) IMPLANT
BAG ZIPLOCK 12X15 (MISCELLANEOUS) ×1 IMPLANT
BLADE SAG 18X100X1.27 (BLADE) ×1 IMPLANT
COVER PERINEAL POST (MISCELLANEOUS) ×1 IMPLANT
COVER SURGICAL LIGHT HANDLE (MISCELLANEOUS) ×1 IMPLANT
CUP ACETBLR 54 OD PINNACLE (Hips) IMPLANT
DERMABOND ADVANCED .7 DNX12 (GAUZE/BANDAGES/DRESSINGS) ×1 IMPLANT
DRAPE FOOT SWITCH (DRAPES) ×1 IMPLANT
DRAPE STERI IOBAN 125X83 (DRAPES) ×1 IMPLANT
DRAPE U-SHAPE 47X51 STRL (DRAPES) ×2 IMPLANT
DRESSING AQUACEL AG SP 3.5X10 (GAUZE/BANDAGES/DRESSINGS) ×1 IMPLANT
DURAPREP 26ML APPLICATOR (WOUND CARE) ×1 IMPLANT
ELECT REM PT RETURN 15FT ADLT (MISCELLANEOUS) ×1 IMPLANT
GLOVE BIO SURGEON STRL SZ 6 (GLOVE) ×1 IMPLANT
GLOVE BIOGEL PI IND STRL 6.5 (GLOVE) ×1 IMPLANT
GLOVE BIOGEL PI IND STRL 7.5 (GLOVE) ×1 IMPLANT
GLOVE ORTHO TXT STRL SZ7.5 (GLOVE) ×2 IMPLANT
GOWN STRL REUS W/ TWL LRG LVL3 (GOWN DISPOSABLE) ×2 IMPLANT
HEAD CERAMIC 36 PLUS5 (Hips) IMPLANT
HOLDER FOLEY CATH W/STRAP (MISCELLANEOUS) ×1 IMPLANT
KIT TURNOVER KIT A (KITS) ×1 IMPLANT
LINER NEUTRAL 54X36MM PLUS 4 (Hips) IMPLANT
MANIFOLD NEPTUNE II (INSTRUMENTS) ×1 IMPLANT
NDL SAFETY ECLIPSE 18X1.5 (NEEDLE) ×1 IMPLANT
PACK ANTERIOR HIP CUSTOM (KITS) ×1 IMPLANT
PENCIL SMOKE EVACUATOR (MISCELLANEOUS) ×1 IMPLANT
SCREW 6.5MMX30MM (Screw) IMPLANT
STEM FEMORAL SZ5 HIGH ACTIS (Stem) IMPLANT
SUT MNCRL AB 4-0 PS2 18 (SUTURE) ×1 IMPLANT
SUT VIC AB 1 CT1 36 (SUTURE) ×3 IMPLANT
SUT VIC AB 2-0 CT1 TAPERPNT 27 (SUTURE) ×2 IMPLANT
SUTURE STRATFX 0 PDS 27 VIOLET (SUTURE) ×1 IMPLANT
SYR 3ML LL SCALE MARK (SYRINGE) ×1 IMPLANT
TOWEL GREEN STERILE FF (TOWEL DISPOSABLE) ×1 IMPLANT
TRAY FOLEY MTR SLVR 14FR STAT (SET/KITS/TRAYS/PACK) IMPLANT
TRAY FOLEY MTR SLVR 16FR STAT (SET/KITS/TRAYS/PACK) ×1 IMPLANT
TUBE SUCTION HIGH CAP CLEAR NV (SUCTIONS) ×1 IMPLANT
WATER STERILE IRR 1000ML POUR (IV SOLUTION) ×1 IMPLANT

## 2023-11-11 NOTE — Op Note (Signed)
 NAME:  Patricia Goodman                ACCOUNT NO.: [id number]      MEDICAL RECORD NO.: [id number]      FACILITY:  [facility]      PHYSICIAN:  [name]  DATE OF BIRTH:  [birth year]     DATE OF PROCEDURE:  11/11/2023                                 OPERATIVE REPORT         PREOPERATIVE DIAGNOSIS: Left  hip osteoarthritis.      POSTOPERATIVE DIAGNOSIS:  Left hip osteoarthritis.      PROCEDURE:  Left total hip replacement through an anterior approach   utilizing DePuy THR system, component size 52 mm pinnacle cup, a size 36+4 neutral   Altrex liner, a size 5 Hi Actis stem with a 36+5 delta ceramic   ball.      SURGEON:  Donnice JONETTA. Car, M.D.      ASSISTANT:  Rosina Calin, PA-C     ANESTHESIA:  Spinal.      SPECIMENS:  None.      COMPLICATIONS:  None.      BLOOD LOSS:  400 cc     DRAINS:  None.      INDICATION OF THE PROCEDURE:  Patricia Goodman is a 58 y.o. female who had   presented to office for evaluation of left hip pain.  Radiographs revealed   progressive degenerative changes with bone-on-bone   articulation of the  hip joint, including subchondral cystic changes and osteophytes.  The patient had painful limited range of   motion significantly affecting their overall quality of life and function.  The patient was failing to    respond to conservative measures including medications and/or injections and activity modification and at this point was ready   to proceed with more definitive measures.  Consent was obtained for   benefit of pain relief.  Specific risks of infection, DVT, component   failure, dislocation, neurovascular injury, and need for revision surgery were reviewed in the office.     PROCEDURE IN DETAIL:  The patient was brought to operative theater.   Once adequate anesthesia, preoperative antibiotics, 2 gm of Ancef , 1 gm of Tranexamic Acid , and 10 mg of Decadron  were administered, the patient was positioned supine on the [employer] table.   Once the patient was safely positioned with adequate padding of boney prominences we predraped out the hip, and used fluoroscopy to confirm orientation of the pelvis.      The left hip was then prepped and draped from proximal iliac crest to   mid thigh with a shower curtain technique.      Time-out was performed identifying the patient, planned procedure, and the appropriate extremity.     An incision was then made 2 cm lateral to the   anterior superior iliac spine extending over the orientation of the   tensor fascia lata muscle and sharp dissection was carried down to the   fascia of the muscle.      The fascia was then incised.  The muscle belly was identified and swept   laterally and retractor placed along the superior neck.  Following   cauterization of the circumflex vessels and removing some pericapsular   fat, a second cobra retractor was placed on the inferior  neck.  A T-capsulotomy was made along the line of the   superior neck to the trochanteric fossa, then extended proximally and   distally.  Tag sutures were placed and the retractors were then placed   intracapsular.  We then identified the trochanteric fossa and   orientation of my neck cut and then made a neck osteotomy with the femur on traction.  The femoral   head was removed without difficulty or complication.  Traction was let   off and retractors were placed posterior and anterior around the   acetabulum.      The labrum and foveal tissue were debrided.  I began reaming with a 45 mm   reamer and reamed up to 51 mm reamer with good bony bed preparation and a 52 mm  cup was chosen.  The final 52 mm Pinnacle cup was then impacted under fluoroscopy to confirm the depth of penetration and orientation with respect to   Abduction and forward flexion.  A screw was placed into the ilium followed by the hole eliminator.  The final   36+4 neutral Altrex liner was impacted with good visualized rim fit.  The cup was  positioned anatomically within the acetabular portion of the pelvis.      At this point, the femur was rolled to 100 degrees.  Further capsule was   released off the inferior aspect of the femoral neck.  I then   released the superior capsule proximally.  With the leg in a neutral position the hook was placed laterally   along the femur under the vastus lateralis origin and elevated manually and then held in position using the hook attachment on the bed.  The leg was then extended and adducted with the leg rolled to 100   degrees of external rotation.  Retractors were placed along the medial calcar and posteriorly over the greater trochanter.  Once the proximal femur was fully   exposed, I used a box osteotome to set orientation.  I then began   broaching with the starting chili pepper broach and passed this by hand and then broached up to 5.  With the 5 broach in place I chose a high offset neck and did several trial reductions.  The offset was appropriate, leg lengths   appeared to be equal best matched with the +5 head ball trial confirmed radiographically.   Given these findings, I went ahead and dislocated the hip, repositioned all   retractors and positioned the right hip in the extended and abducted position.  The final 5 Hi Actis stem was   chosen and it was impacted down to the level of neck cut.  Based on this   and the trial reductions, a final 36+5 delta ceramic ball was chosen and   impacted onto a clean and dry trunnion, and the hip was reduced.  The   hip had been irrigated throughout the case again at this point.  I did   reapproximate the superior capsular leaflet to the anterior leaflet   using #1 Vicryl.  The fascia of the   tensor fascia lata muscle was then reapproximated using #1 Vicryl and #0 Stratafix sutures.  The   remaining wound was closed with 2-0 Vicryl and running 4-0 Monocryl.   The hip was cleaned, dried, and dressed sterilely using Dermabond and   Aquacel  dressing.  The patient was then brought   to recovery room in stable condition tolerating the procedure well.    Rosina  Patti, PA-C was present for the entirety of the case involved from   preoperative positioning, perioperative retractor management, general   facilitation of the case, as well as primary wound closure as assistant.            Donnice CORDOBA Ernie, M.D.        11/11/2023 8:36 AM

## 2023-11-11 NOTE — Interval H&P Note (Signed)
 History and Physical Interval Note:  11/11/2023 8:36 AM  Patricia Goodman  has presented today for surgery, with the diagnosis of Left hip osteoarthritis.  The various methods of treatment have been discussed with the patient and family. After consideration of risks, benefits and other options for treatment, the patient has consented to  Procedure(s): ARTHROPLASTY, HIP, TOTAL, ANTERIOR APPROACH (Left) as a surgical intervention.  The patient's history has been reviewed, patient examined, no change in status, stable for surgery.  I have reviewed the patient's chart and labs.  Questions were answered to the patient's satisfaction.     [name]

## 2023-11-11 NOTE — Evaluation (Addendum)
 Physical Therapy Evaluation Patient Details Name: Patricia Goodman MRN: [id number] DOB: 03-25-1966 Today's Date: 11/11/2023  History of Present Illness  Pt is a 58 year old female s/p L THA 11/11/23.  Clinical Impression  Pt is s/p THA resulting in the deficits listed below (see PT Problem List). Pt agreeable to session and seen in PACU. Orthostatics assessed, pt asymptomatic throughout, see below for values. She is able to amb to bathroom and voids bladder. Pt continues to amb on unit reporting that she feels good. Able to complete steps per home setup, uses HHA and wall to assist, educated on sequencing. Demonstrated reverse technique if needed but unlikely. HEP reviewed and provided to pt. Pt will benefit from acute skilled PT to increase their independence and safety with mobility to facilitate discharge.    Orthostatic BP: Supine: 134/83 Sitting: 137/84 Standing: 102/82         If plan is discharge home, recommend the following: A little help with walking and/or transfers;A little help with bathing/dressing/bathroom;Assistance with cooking/housework;Help with stairs or ramp for entrance;Assist for transportation   Can travel by private vehicle        Equipment Recommendations Rolling walker (2 wheels)  Recommendations for Other Services       Functional Status Assessment Patient has had a recent decline in their functional status and demonstrates the ability to make significant improvements in function in a reasonable and predictable amount of time.     Precautions / Restrictions Precautions Precautions: Fall Recall of Precautions/Restrictions: Intact Restrictions Weight Bearing Restrictions Per Provider Order: Yes LLE Weight Bearing Per Provider Order: Weight bearing as tolerated      Mobility  Bed Mobility Overal bed mobility: Needs Assistance Bed Mobility: Supine to Sit     Supine to sit: Supervision     General bed mobility comments: cues for sequencing     Transfers Overall transfer level: Needs assistance Equipment used: Rolling walker (2 wheels) Transfers: Sit to/from Stand Sit to Stand: Contact guard assist                Ambulation/Gait Ambulation/Gait assistance: Supervision Gait Distance (Feet): 120 Feet Assistive device: Rolling walker (2 wheels) Gait Pattern/deviations: Step-to pattern, Decreased stance time - left, Decreased step length - right, Antalgic, Wide base of support Gait velocity: dec     General Gait Details: Pt able to amb on unit and completes step to pattern with RW using moderate UE assist and is able to WB effectively through LLE.  Stairs Stairs: Yes Stairs assistance: Contact guard assist Stair Management: No rails Number of Stairs: 3 General stair comments: Pt uses HHA using step to pattern on stairs and is able to ascend and descend mimicing home setup. Husband present to assess technique and will be able to provide necessary assist for safe home entry/exit  Wheelchair Mobility     Tilt Bed    Modified Rankin (Stroke Patients Only)       Balance Overall balance assessment: Needs assistance Sitting-balance support: Feet supported Sitting balance-Leahy Scale: Good     Standing balance support: Reliant on assistive device for balance, During functional activity Standing balance-Leahy Scale: Fair                               Pertinent Vitals/Pain Pain Assessment Pain Assessment: Faces Faces Pain Scale: Hurts a little bit Pain Location: L hip Pain Descriptors / Indicators: Nagging, Discomfort, Aching Pain Intervention(s): Limited activity within patient's  tolerance, Monitored during session, Repositioned    Home Living Family/patient expects to be discharged to:: Private residence Living Arrangements: Spouse/significant other Available Help at Discharge: Available 24 hours/day Type of Home: House Home Access: Stairs to enter Entrance Stairs-Rails: None Entrance  Stairs-Number of Steps: 3   Home Layout: One level Home Equipment: Shower seat - built in;Crutches;Cane - single point      Prior Function Prior Level of Function : Independent/Modified Independent                     Extremity/Trunk Assessment   Upper Extremity Assessment Upper Extremity Assessment: Overall WFL for tasks assessed    Lower Extremity Assessment Lower Extremity Assessment: Generalized weakness;LLE deficits/detail LLE Deficits / Details: LLE activity tolerance consistent with post operative status    Cervical / Trunk Assessment Cervical / Trunk Assessment: Normal  Communication   Communication Communication: No apparent difficulties    Cognition Arousal: Alert Behavior During Therapy: WFL for tasks assessed/performed   PT - Cognitive impairments: No apparent impairments                         Following commands: Intact       Cueing Cueing Techniques: Verbal cues, Gestural cues     General Comments      Exercises     Assessment/Plan    PT Assessment All further PT needs can be met in the next venue of care  PT Problem List Decreased strength;Decreased balance;Decreased range of motion;Decreased mobility;Decreased activity tolerance       PT Treatment Interventions      PT Goals (Current goals can be found in the Care Plan section)  Acute Rehab PT Goals Patient Stated Goal: return home and improve amb PT Goal Formulation: With patient Time For Goal Achievement: 11/25/23 Potential to Achieve Goals: Good    Frequency       Co-evaluation               AM-PAC PT 6 Clicks Mobility  Outcome Measure Help needed turning from your back to your side while in a flat bed without using bedrails?: A Little Help needed moving from lying on your back to sitting on the side of a flat bed without using bedrails?: A Little Help needed moving to and from a bed to a chair (including a wheelchair)?: A Little Help needed standing  up from a chair using your arms (e.g., wheelchair or bedside chair)?: A Little Help needed to walk in hospital room?: A Little Help needed climbing 3-5 steps with a railing? : A Little 6 Click Score: 18    End of Session Equipment Utilized During Treatment: Gait belt Activity Tolerance: Patient tolerated treatment well Patient left: in chair;with nursing/sitter in room;with family/visitor present Nurse Communication: Mobility status PT Visit Diagnosis: Difficulty in walking, not elsewhere classified (R26.2);Unsteadiness on feet (R26.81)    Time: 8651-8571 PT Time Calculation (min) (ACUTE ONLY): 40 min   Charges:   PT Evaluation $PT Eval Low Complexity: 1 Low PT Treatments $Gait Training: 23-37 mins PT General Charges $$ ACUTE PT VISIT: 1 Visit         Stann, PT Acute Rehabilitation Services Office: [phone] 11/11/2023   Stann DELENA Ohara 11/11/2023, 2:53 PM

## 2023-11-11 NOTE — Transfer of Care (Signed)
 Immediate Anesthesia Transfer of Care Note  Patient: Patricia Goodman  Procedure(s) Performed: ARTHROPLASTY, HIP, TOTAL, ANTERIOR APPROACH (Left: Hip)  Patient Location: PACU  Anesthesia Type:MAC, Regional, and Spinal  Level of Consciousness: awake  Airway & Oxygen Therapy: Patient Spontanous Breathing and Patient connected to face mask oxygen  Post-op Assessment: Report given to RN and Post -op Vital signs reviewed and stable  Post vital signs: Reviewed and stable  Last Vitals:  Vitals Value Taken Time  BP 90/65 11/11/23 11:03  Temp    Pulse 89 11/11/23 11:04  Resp 13 11/11/23 11:04  SpO2 100 % 11/11/23 11:04  Vitals shown include unfiled device data.  Last Pain:  Vitals:   11/11/23 0747  TempSrc: Oral  PainSc:          Complications: No notable events documented.

## 2023-11-11 NOTE — Anesthesia Postprocedure Evaluation (Signed)
 Anesthesia Post Note  Patient: Patricia Goodman  Procedure(s) Performed: ARTHROPLASTY, HIP, TOTAL, ANTERIOR APPROACH (Left: Hip)     Patient location during evaluation: PACU Anesthesia Type: MAC and Spinal Level of consciousness: oriented and awake and alert Pain management: pain level controlled Vital Signs Assessment: post-procedure vital signs reviewed and stable Respiratory status: spontaneous breathing, respiratory function stable and patient connected to nasal cannula oxygen Cardiovascular status: blood pressure returned to baseline and stable Postop Assessment: no headache, no backache and no apparent nausea or vomiting Anesthetic complications: no   No notable events documented.  Last Vitals:  Vitals:   11/11/23 1248 11/11/23 1312  BP: 116/74   Pulse: 61 78  Resp: 20   Temp: (!) 36.1 C   SpO2: 93% 99%    Last Pain:  Vitals:   11/11/23 1312  TempSrc:   PainSc: 7                  Quida Glasser S

## 2023-11-11 NOTE — Discharge Instructions (Signed)

## 2023-11-11 NOTE — Anesthesia Preprocedure Evaluation (Signed)
 Anesthesia Evaluation  Patient identified by MRN, date of birth, ID band Patient awake    Reviewed: Allergy & Precautions, H&P , NPO status , Patient's Chart, lab work & pertinent test results  Airway Mallampati: II   Neck ROM: full    Dental   Pulmonary former smoker   breath sounds clear to auscultation       Cardiovascular negative cardio ROS  Rhythm:regular Rate:Normal     Neuro/Psych    GI/Hepatic   Endo/Other    Renal/GU      Musculoskeletal  (+) Arthritis ,    Abdominal   Peds  Hematology   Anesthesia Other Findings   Reproductive/Obstetrics                              Anesthesia Physical Anesthesia Plan  ASA: 2  Anesthesia Plan: MAC and Spinal   Post-op Pain Management:    Induction: Intravenous  PONV Risk Score and Plan: 2 and Propofol  infusion, Ondansetron , Midazolam  and Treatment may vary due to age or medical condition  Airway Management Planned: Simple Face Mask  Additional Equipment:   Intra-op Plan:   Post-operative Plan:   Informed Consent: I have reviewed the patients History and Physical, chart, labs and discussed the procedure including the risks, benefits and alternatives for the proposed anesthesia with the patient or authorized representative who has indicated his/her understanding and acceptance.     Dental advisory given  Plan Discussed with: CRNA, Anesthesiologist and Surgeon  Anesthesia Plan Comments:         Anesthesia Quick Evaluation

## 2023-11-12 ENCOUNTER — Encounter (HOSPITAL_COMMUNITY): Payer: Self-pay | Admitting: Orthopedic Surgery

## 2023-12-16 DIAGNOSIS — Z1231 Encounter for screening mammogram for malignant neoplasm of breast: Secondary | ICD-10-CM | POA: Diagnosis not present

## 2023-12-25 DIAGNOSIS — Z4889 Encounter for other specified surgical aftercare: Secondary | ICD-10-CM | POA: Diagnosis not present

## 2024-01-26 ENCOUNTER — Encounter: Payer: Self-pay | Admitting: Radiology

## 2024-03-11 DIAGNOSIS — Z124 Encounter for screening for malignant neoplasm of cervix: Secondary | ICD-10-CM | POA: Diagnosis not present

## 2024-03-11 DIAGNOSIS — R87810 Cervical high risk human papillomavirus (HPV) DNA test positive: Secondary | ICD-10-CM | POA: Diagnosis not present

## 2024-03-11 DIAGNOSIS — N870 Mild cervical dysplasia: Secondary | ICD-10-CM | POA: Diagnosis not present

## 2024-03-11 DIAGNOSIS — Z01419 Encounter for gynecological examination (general) (routine) without abnormal findings: Secondary | ICD-10-CM | POA: Diagnosis not present

## 2024-03-11 DIAGNOSIS — N951 Menopausal and female climacteric states: Secondary | ICD-10-CM | POA: Diagnosis not present

## 2024-03-11 DIAGNOSIS — R87610 Atypical squamous cells of undetermined significance on cytologic smear of cervix (ASC-US): Secondary | ICD-10-CM | POA: Diagnosis not present

## 2024-03-11 DIAGNOSIS — Z1151 Encounter for screening for human papillomavirus (HPV): Secondary | ICD-10-CM | POA: Diagnosis not present
